# Patient Record
Sex: Female | Born: 1945
Health system: Southern US, Community
[De-identification: ages and names within clinical notes are randomized; demographics above are authoritative.]

## PROBLEM LIST (undated history)

## (undated) DIAGNOSIS — J449 Chronic obstructive pulmonary disease, unspecified: Secondary | ICD-10-CM

## (undated) DIAGNOSIS — E785 Hyperlipidemia, unspecified: Secondary | ICD-10-CM

## (undated) DIAGNOSIS — N189 Chronic kidney disease, unspecified: Secondary | ICD-10-CM

## (undated) DIAGNOSIS — I519 Heart disease, unspecified: Secondary | ICD-10-CM

## (undated) DIAGNOSIS — I10 Essential (primary) hypertension: Secondary | ICD-10-CM

## (undated) HISTORY — PX: KNEE ARTHROSCOPY: SUR90

## (undated) HISTORY — DX: Essential (primary) hypertension: I10

## (undated) HISTORY — DX: Chronic kidney disease, unspecified: N18.9

## (undated) HISTORY — DX: Chronic obstructive pulmonary disease, unspecified: J44.9

## (undated) HISTORY — DX: Heart disease, unspecified: I51.9

## (undated) HISTORY — PX: CARDIAC CATHETERIZATION: SHX172

## (undated) HISTORY — DX: Hyperlipidemia, unspecified: E78.5

## (undated) HISTORY — PX: TUBAL LIGATION: SHX77

## (undated) HISTORY — PX: SHOULDER ARTHROSCOPY: SHX128

---

## 2006-08-09 DIAGNOSIS — I519 Heart disease, unspecified: Secondary | ICD-10-CM

## 2006-08-09 HISTORY — DX: Heart disease, unspecified: I51.9

## 2014-08-16 DIAGNOSIS — M1712 Unilateral primary osteoarthritis, left knee: Secondary | ICD-10-CM | POA: Diagnosis not present

## 2014-10-02 DIAGNOSIS — G5701 Lesion of sciatic nerve, right lower limb: Secondary | ICD-10-CM | POA: Diagnosis not present

## 2014-10-02 DIAGNOSIS — H8110 Benign paroxysmal vertigo, unspecified ear: Secondary | ICD-10-CM | POA: Diagnosis not present

## 2014-10-02 DIAGNOSIS — M19019 Primary osteoarthritis, unspecified shoulder: Secondary | ICD-10-CM | POA: Diagnosis not present

## 2014-10-02 DIAGNOSIS — M179 Osteoarthritis of knee, unspecified: Secondary | ICD-10-CM | POA: Diagnosis not present

## 2014-10-09 DIAGNOSIS — M25551 Pain in right hip: Secondary | ICD-10-CM | POA: Diagnosis not present

## 2014-10-09 DIAGNOSIS — M79651 Pain in right thigh: Secondary | ICD-10-CM | POA: Diagnosis not present

## 2014-10-09 DIAGNOSIS — M6281 Muscle weakness (generalized): Secondary | ICD-10-CM | POA: Diagnosis not present

## 2014-10-09 DIAGNOSIS — R262 Difficulty in walking, not elsewhere classified: Secondary | ICD-10-CM | POA: Diagnosis not present

## 2014-10-11 DIAGNOSIS — M79651 Pain in right thigh: Secondary | ICD-10-CM | POA: Diagnosis not present

## 2014-10-11 DIAGNOSIS — M25551 Pain in right hip: Secondary | ICD-10-CM | POA: Diagnosis not present

## 2014-10-11 DIAGNOSIS — R262 Difficulty in walking, not elsewhere classified: Secondary | ICD-10-CM | POA: Diagnosis not present

## 2014-10-11 DIAGNOSIS — M6281 Muscle weakness (generalized): Secondary | ICD-10-CM | POA: Diagnosis not present

## 2014-10-16 DIAGNOSIS — M25551 Pain in right hip: Secondary | ICD-10-CM | POA: Diagnosis not present

## 2014-10-16 DIAGNOSIS — M6281 Muscle weakness (generalized): Secondary | ICD-10-CM | POA: Diagnosis not present

## 2014-10-16 DIAGNOSIS — R262 Difficulty in walking, not elsewhere classified: Secondary | ICD-10-CM | POA: Diagnosis not present

## 2014-10-16 DIAGNOSIS — M79651 Pain in right thigh: Secondary | ICD-10-CM | POA: Diagnosis not present

## 2014-10-18 DIAGNOSIS — M6281 Muscle weakness (generalized): Secondary | ICD-10-CM | POA: Diagnosis not present

## 2014-10-18 DIAGNOSIS — M25551 Pain in right hip: Secondary | ICD-10-CM | POA: Diagnosis not present

## 2014-10-18 DIAGNOSIS — M79651 Pain in right thigh: Secondary | ICD-10-CM | POA: Diagnosis not present

## 2014-10-18 DIAGNOSIS — R262 Difficulty in walking, not elsewhere classified: Secondary | ICD-10-CM | POA: Diagnosis not present

## 2014-10-23 DIAGNOSIS — R262 Difficulty in walking, not elsewhere classified: Secondary | ICD-10-CM | POA: Diagnosis not present

## 2014-10-23 DIAGNOSIS — M6281 Muscle weakness (generalized): Secondary | ICD-10-CM | POA: Diagnosis not present

## 2014-10-23 DIAGNOSIS — M79651 Pain in right thigh: Secondary | ICD-10-CM | POA: Diagnosis not present

## 2014-10-23 DIAGNOSIS — M25551 Pain in right hip: Secondary | ICD-10-CM | POA: Diagnosis not present

## 2014-10-28 DIAGNOSIS — M6281 Muscle weakness (generalized): Secondary | ICD-10-CM | POA: Diagnosis not present

## 2014-10-28 DIAGNOSIS — M25551 Pain in right hip: Secondary | ICD-10-CM | POA: Diagnosis not present

## 2014-10-28 DIAGNOSIS — M79651 Pain in right thigh: Secondary | ICD-10-CM | POA: Diagnosis not present

## 2014-10-28 DIAGNOSIS — R262 Difficulty in walking, not elsewhere classified: Secondary | ICD-10-CM | POA: Diagnosis not present

## 2014-10-30 DIAGNOSIS — R262 Difficulty in walking, not elsewhere classified: Secondary | ICD-10-CM | POA: Diagnosis not present

## 2014-10-30 DIAGNOSIS — Z6841 Body Mass Index (BMI) 40.0 and over, adult: Secondary | ICD-10-CM | POA: Diagnosis not present

## 2014-10-30 DIAGNOSIS — M25551 Pain in right hip: Secondary | ICD-10-CM | POA: Diagnosis not present

## 2014-10-30 DIAGNOSIS — M79651 Pain in right thigh: Secondary | ICD-10-CM | POA: Diagnosis not present

## 2014-10-30 DIAGNOSIS — M6281 Muscle weakness (generalized): Secondary | ICD-10-CM | POA: Diagnosis not present

## 2014-10-30 DIAGNOSIS — M19019 Primary osteoarthritis, unspecified shoulder: Secondary | ICD-10-CM | POA: Diagnosis not present

## 2014-10-30 DIAGNOSIS — M179 Osteoarthritis of knee, unspecified: Secondary | ICD-10-CM | POA: Diagnosis not present

## 2014-11-04 DIAGNOSIS — R262 Difficulty in walking, not elsewhere classified: Secondary | ICD-10-CM | POA: Diagnosis not present

## 2014-11-04 DIAGNOSIS — M79651 Pain in right thigh: Secondary | ICD-10-CM | POA: Diagnosis not present

## 2014-11-04 DIAGNOSIS — M6281 Muscle weakness (generalized): Secondary | ICD-10-CM | POA: Diagnosis not present

## 2014-11-04 DIAGNOSIS — M25551 Pain in right hip: Secondary | ICD-10-CM | POA: Diagnosis not present

## 2014-11-06 DIAGNOSIS — R262 Difficulty in walking, not elsewhere classified: Secondary | ICD-10-CM | POA: Diagnosis not present

## 2014-11-06 DIAGNOSIS — M25551 Pain in right hip: Secondary | ICD-10-CM | POA: Diagnosis not present

## 2014-11-06 DIAGNOSIS — M79651 Pain in right thigh: Secondary | ICD-10-CM | POA: Diagnosis not present

## 2014-11-06 DIAGNOSIS — M6281 Muscle weakness (generalized): Secondary | ICD-10-CM | POA: Diagnosis not present

## 2014-11-08 DIAGNOSIS — E782 Mixed hyperlipidemia: Secondary | ICD-10-CM | POA: Diagnosis not present

## 2014-11-08 DIAGNOSIS — R7301 Impaired fasting glucose: Secondary | ICD-10-CM | POA: Diagnosis not present

## 2014-11-08 DIAGNOSIS — I129 Hypertensive chronic kidney disease with stage 1 through stage 4 chronic kidney disease, or unspecified chronic kidney disease: Secondary | ICD-10-CM | POA: Diagnosis not present

## 2014-11-11 DIAGNOSIS — M79651 Pain in right thigh: Secondary | ICD-10-CM | POA: Diagnosis not present

## 2014-11-11 DIAGNOSIS — R262 Difficulty in walking, not elsewhere classified: Secondary | ICD-10-CM | POA: Diagnosis not present

## 2014-11-11 DIAGNOSIS — M25551 Pain in right hip: Secondary | ICD-10-CM | POA: Diagnosis not present

## 2014-11-11 DIAGNOSIS — M6281 Muscle weakness (generalized): Secondary | ICD-10-CM | POA: Diagnosis not present

## 2014-11-13 DIAGNOSIS — M6281 Muscle weakness (generalized): Secondary | ICD-10-CM | POA: Diagnosis not present

## 2014-11-13 DIAGNOSIS — R262 Difficulty in walking, not elsewhere classified: Secondary | ICD-10-CM | POA: Diagnosis not present

## 2014-11-13 DIAGNOSIS — M25551 Pain in right hip: Secondary | ICD-10-CM | POA: Diagnosis not present

## 2014-11-13 DIAGNOSIS — M79651 Pain in right thigh: Secondary | ICD-10-CM | POA: Diagnosis not present

## 2014-11-18 DIAGNOSIS — N182 Chronic kidney disease, stage 2 (mild): Secondary | ICD-10-CM | POA: Diagnosis not present

## 2014-11-18 DIAGNOSIS — I129 Hypertensive chronic kidney disease with stage 1 through stage 4 chronic kidney disease, or unspecified chronic kidney disease: Secondary | ICD-10-CM | POA: Diagnosis not present

## 2014-11-18 DIAGNOSIS — E782 Mixed hyperlipidemia: Secondary | ICD-10-CM | POA: Diagnosis not present

## 2014-11-18 DIAGNOSIS — M6281 Muscle weakness (generalized): Secondary | ICD-10-CM | POA: Diagnosis not present

## 2014-11-18 DIAGNOSIS — M25551 Pain in right hip: Secondary | ICD-10-CM | POA: Diagnosis not present

## 2014-11-18 DIAGNOSIS — R7301 Impaired fasting glucose: Secondary | ICD-10-CM | POA: Diagnosis not present

## 2014-11-18 DIAGNOSIS — M79651 Pain in right thigh: Secondary | ICD-10-CM | POA: Diagnosis not present

## 2014-11-18 DIAGNOSIS — R262 Difficulty in walking, not elsewhere classified: Secondary | ICD-10-CM | POA: Diagnosis not present

## 2014-11-20 DIAGNOSIS — M25551 Pain in right hip: Secondary | ICD-10-CM | POA: Diagnosis not present

## 2014-11-20 DIAGNOSIS — Z0000 Encounter for general adult medical examination without abnormal findings: Secondary | ICD-10-CM | POA: Diagnosis not present

## 2014-11-20 DIAGNOSIS — M79651 Pain in right thigh: Secondary | ICD-10-CM | POA: Diagnosis not present

## 2014-11-20 DIAGNOSIS — Z139 Encounter for screening, unspecified: Secondary | ICD-10-CM | POA: Diagnosis not present

## 2014-11-20 DIAGNOSIS — Z1389 Encounter for screening for other disorder: Secondary | ICD-10-CM | POA: Diagnosis not present

## 2014-11-20 DIAGNOSIS — M6281 Muscle weakness (generalized): Secondary | ICD-10-CM | POA: Diagnosis not present

## 2014-11-20 DIAGNOSIS — R262 Difficulty in walking, not elsewhere classified: Secondary | ICD-10-CM | POA: Diagnosis not present

## 2014-11-25 DIAGNOSIS — M25551 Pain in right hip: Secondary | ICD-10-CM | POA: Diagnosis not present

## 2014-11-25 DIAGNOSIS — R262 Difficulty in walking, not elsewhere classified: Secondary | ICD-10-CM | POA: Diagnosis not present

## 2014-11-25 DIAGNOSIS — M6281 Muscle weakness (generalized): Secondary | ICD-10-CM | POA: Diagnosis not present

## 2014-11-25 DIAGNOSIS — M79651 Pain in right thigh: Secondary | ICD-10-CM | POA: Diagnosis not present

## 2014-11-27 DIAGNOSIS — M25551 Pain in right hip: Secondary | ICD-10-CM | POA: Diagnosis not present

## 2014-11-27 DIAGNOSIS — M79651 Pain in right thigh: Secondary | ICD-10-CM | POA: Diagnosis not present

## 2014-11-27 DIAGNOSIS — M6281 Muscle weakness (generalized): Secondary | ICD-10-CM | POA: Diagnosis not present

## 2014-11-27 DIAGNOSIS — R262 Difficulty in walking, not elsewhere classified: Secondary | ICD-10-CM | POA: Diagnosis not present

## 2014-11-29 DIAGNOSIS — M7989 Other specified soft tissue disorders: Secondary | ICD-10-CM | POA: Diagnosis not present

## 2014-11-29 DIAGNOSIS — I8391 Asymptomatic varicose veins of right lower extremity: Secondary | ICD-10-CM | POA: Diagnosis not present

## 2014-11-29 DIAGNOSIS — I83813 Varicose veins of bilateral lower extremities with pain: Secondary | ICD-10-CM | POA: Diagnosis not present

## 2014-12-02 DIAGNOSIS — M6281 Muscle weakness (generalized): Secondary | ICD-10-CM | POA: Diagnosis not present

## 2014-12-02 DIAGNOSIS — M79651 Pain in right thigh: Secondary | ICD-10-CM | POA: Diagnosis not present

## 2014-12-02 DIAGNOSIS — R262 Difficulty in walking, not elsewhere classified: Secondary | ICD-10-CM | POA: Diagnosis not present

## 2014-12-02 DIAGNOSIS — M1712 Unilateral primary osteoarthritis, left knee: Secondary | ICD-10-CM | POA: Diagnosis not present

## 2014-12-02 DIAGNOSIS — M25551 Pain in right hip: Secondary | ICD-10-CM | POA: Diagnosis not present

## 2014-12-02 DIAGNOSIS — M25562 Pain in left knee: Secondary | ICD-10-CM | POA: Diagnosis not present

## 2014-12-04 DIAGNOSIS — M79651 Pain in right thigh: Secondary | ICD-10-CM | POA: Diagnosis not present

## 2014-12-04 DIAGNOSIS — R262 Difficulty in walking, not elsewhere classified: Secondary | ICD-10-CM | POA: Diagnosis not present

## 2014-12-04 DIAGNOSIS — M25551 Pain in right hip: Secondary | ICD-10-CM | POA: Diagnosis not present

## 2014-12-04 DIAGNOSIS — M6281 Muscle weakness (generalized): Secondary | ICD-10-CM | POA: Diagnosis not present

## 2014-12-20 DIAGNOSIS — I83893 Varicose veins of bilateral lower extremities with other complications: Secondary | ICD-10-CM | POA: Diagnosis not present

## 2014-12-20 DIAGNOSIS — I83813 Varicose veins of bilateral lower extremities with pain: Secondary | ICD-10-CM | POA: Diagnosis not present

## 2014-12-27 DIAGNOSIS — I83813 Varicose veins of bilateral lower extremities with pain: Secondary | ICD-10-CM | POA: Diagnosis not present

## 2014-12-27 DIAGNOSIS — I83893 Varicose veins of bilateral lower extremities with other complications: Secondary | ICD-10-CM | POA: Diagnosis not present

## 2015-01-14 DIAGNOSIS — I83813 Varicose veins of bilateral lower extremities with pain: Secondary | ICD-10-CM | POA: Diagnosis not present

## 2015-01-16 DIAGNOSIS — I83893 Varicose veins of bilateral lower extremities with other complications: Secondary | ICD-10-CM | POA: Diagnosis not present

## 2015-01-16 DIAGNOSIS — I8392 Asymptomatic varicose veins of left lower extremity: Secondary | ICD-10-CM | POA: Diagnosis not present

## 2015-01-16 DIAGNOSIS — I82812 Embolism and thrombosis of superficial veins of left lower extremities: Secondary | ICD-10-CM | POA: Diagnosis not present

## 2015-01-20 DIAGNOSIS — I872 Venous insufficiency (chronic) (peripheral): Secondary | ICD-10-CM | POA: Diagnosis not present

## 2015-01-21 DIAGNOSIS — I872 Venous insufficiency (chronic) (peripheral): Secondary | ICD-10-CM | POA: Diagnosis not present

## 2015-02-03 DIAGNOSIS — M25511 Pain in right shoulder: Secondary | ICD-10-CM | POA: Diagnosis not present

## 2015-02-03 DIAGNOSIS — M12811 Other specific arthropathies, not elsewhere classified, right shoulder: Secondary | ICD-10-CM | POA: Diagnosis not present

## 2015-02-06 DIAGNOSIS — I8390 Asymptomatic varicose veins of unspecified lower extremity: Secondary | ICD-10-CM | POA: Diagnosis not present

## 2015-02-06 DIAGNOSIS — I83891 Varicose veins of right lower extremities with other complications: Secondary | ICD-10-CM | POA: Diagnosis not present

## 2015-02-06 DIAGNOSIS — I83813 Varicose veins of bilateral lower extremities with pain: Secondary | ICD-10-CM | POA: Diagnosis not present

## 2015-02-06 DIAGNOSIS — I8391 Asymptomatic varicose veins of right lower extremity: Secondary | ICD-10-CM | POA: Diagnosis not present

## 2015-03-13 DIAGNOSIS — E782 Mixed hyperlipidemia: Secondary | ICD-10-CM | POA: Diagnosis not present

## 2015-03-13 DIAGNOSIS — R7309 Other abnormal glucose: Secondary | ICD-10-CM | POA: Diagnosis not present

## 2015-03-20 DIAGNOSIS — E782 Mixed hyperlipidemia: Secondary | ICD-10-CM | POA: Diagnosis not present

## 2015-03-20 DIAGNOSIS — I129 Hypertensive chronic kidney disease with stage 1 through stage 4 chronic kidney disease, or unspecified chronic kidney disease: Secondary | ICD-10-CM | POA: Diagnosis not present

## 2015-03-20 DIAGNOSIS — R7309 Other abnormal glucose: Secondary | ICD-10-CM | POA: Diagnosis not present

## 2015-03-20 DIAGNOSIS — N182 Chronic kidney disease, stage 2 (mild): Secondary | ICD-10-CM | POA: Diagnosis not present

## 2015-03-31 DIAGNOSIS — Z1231 Encounter for screening mammogram for malignant neoplasm of breast: Secondary | ICD-10-CM | POA: Diagnosis not present

## 2015-04-24 DIAGNOSIS — G8929 Other chronic pain: Secondary | ICD-10-CM | POA: Diagnosis not present

## 2015-04-24 DIAGNOSIS — Z6841 Body Mass Index (BMI) 40.0 and over, adult: Secondary | ICD-10-CM | POA: Diagnosis not present

## 2015-04-24 DIAGNOSIS — M2550 Pain in unspecified joint: Secondary | ICD-10-CM | POA: Diagnosis not present

## 2015-05-10 DIAGNOSIS — M25511 Pain in right shoulder: Secondary | ICD-10-CM | POA: Diagnosis not present

## 2015-05-12 DIAGNOSIS — M009 Pyogenic arthritis, unspecified: Secondary | ICD-10-CM | POA: Diagnosis not present

## 2015-05-12 DIAGNOSIS — Z79899 Other long term (current) drug therapy: Secondary | ICD-10-CM | POA: Diagnosis not present

## 2015-05-12 DIAGNOSIS — E7800 Pure hypercholesterolemia, unspecified: Secondary | ICD-10-CM | POA: Diagnosis not present

## 2015-05-12 DIAGNOSIS — R0602 Shortness of breath: Secondary | ICD-10-CM | POA: Diagnosis not present

## 2015-05-12 DIAGNOSIS — K219 Gastro-esophageal reflux disease without esophagitis: Secondary | ICD-10-CM | POA: Diagnosis not present

## 2015-05-12 DIAGNOSIS — E876 Hypokalemia: Secondary | ICD-10-CM | POA: Diagnosis not present

## 2015-05-12 DIAGNOSIS — R0902 Hypoxemia: Secondary | ICD-10-CM | POA: Diagnosis not present

## 2015-05-12 DIAGNOSIS — Z7952 Long term (current) use of systemic steroids: Secondary | ICD-10-CM | POA: Diagnosis not present

## 2015-05-12 DIAGNOSIS — J309 Allergic rhinitis, unspecified: Secondary | ICD-10-CM | POA: Diagnosis not present

## 2015-05-12 DIAGNOSIS — B957 Other staphylococcus as the cause of diseases classified elsewhere: Secondary | ICD-10-CM | POA: Diagnosis not present

## 2015-05-12 DIAGNOSIS — M25511 Pain in right shoulder: Secondary | ICD-10-CM | POA: Diagnosis not present

## 2015-05-12 DIAGNOSIS — M00011 Staphylococcal arthritis, right shoulder: Secondary | ICD-10-CM | POA: Diagnosis not present

## 2015-05-12 DIAGNOSIS — I509 Heart failure, unspecified: Secondary | ICD-10-CM | POA: Diagnosis not present

## 2015-05-12 DIAGNOSIS — A4901 Methicillin susceptible Staphylococcus aureus infection, unspecified site: Secondary | ICD-10-CM | POA: Diagnosis not present

## 2015-05-12 DIAGNOSIS — D649 Anemia, unspecified: Secondary | ICD-10-CM | POA: Diagnosis not present

## 2015-05-12 DIAGNOSIS — Z01810 Encounter for preprocedural cardiovascular examination: Secondary | ICD-10-CM | POA: Diagnosis not present

## 2015-05-12 DIAGNOSIS — M25411 Effusion, right shoulder: Secondary | ICD-10-CM | POA: Diagnosis not present

## 2015-05-12 DIAGNOSIS — I517 Cardiomegaly: Secondary | ICD-10-CM | POA: Diagnosis not present

## 2015-05-12 DIAGNOSIS — M00811 Arthritis due to other bacteria, right shoulder: Secondary | ICD-10-CM | POA: Diagnosis not present

## 2015-05-12 DIAGNOSIS — A419 Sepsis, unspecified organism: Secondary | ICD-10-CM | POA: Diagnosis not present

## 2015-05-12 DIAGNOSIS — Z7982 Long term (current) use of aspirin: Secondary | ICD-10-CM | POA: Diagnosis not present

## 2015-05-12 DIAGNOSIS — Z23 Encounter for immunization: Secondary | ICD-10-CM | POA: Diagnosis not present

## 2015-05-12 DIAGNOSIS — I10 Essential (primary) hypertension: Secondary | ICD-10-CM | POA: Diagnosis not present

## 2015-05-12 DIAGNOSIS — Z452 Encounter for adjustment and management of vascular access device: Secondary | ICD-10-CM | POA: Diagnosis not present

## 2015-05-12 DIAGNOSIS — G8918 Other acute postprocedural pain: Secondary | ICD-10-CM | POA: Diagnosis not present

## 2015-05-12 DIAGNOSIS — Z87891 Personal history of nicotine dependence: Secondary | ICD-10-CM | POA: Diagnosis not present

## 2015-05-12 DIAGNOSIS — Z6841 Body Mass Index (BMI) 40.0 and over, adult: Secondary | ICD-10-CM | POA: Diagnosis not present

## 2015-05-12 DIAGNOSIS — J811 Chronic pulmonary edema: Secondary | ICD-10-CM | POA: Diagnosis not present

## 2015-05-22 DIAGNOSIS — Z23 Encounter for immunization: Secondary | ICD-10-CM | POA: Diagnosis not present

## 2015-05-22 DIAGNOSIS — G8918 Other acute postprocedural pain: Secondary | ICD-10-CM | POA: Diagnosis not present

## 2015-05-22 DIAGNOSIS — Z87891 Personal history of nicotine dependence: Secondary | ICD-10-CM | POA: Diagnosis not present

## 2015-05-22 DIAGNOSIS — D649 Anemia, unspecified: Secondary | ICD-10-CM | POA: Diagnosis not present

## 2015-05-22 DIAGNOSIS — I517 Cardiomegaly: Secondary | ICD-10-CM | POA: Diagnosis not present

## 2015-05-22 DIAGNOSIS — Z7982 Long term (current) use of aspirin: Secondary | ICD-10-CM | POA: Diagnosis not present

## 2015-05-22 DIAGNOSIS — E876 Hypokalemia: Secondary | ICD-10-CM | POA: Diagnosis not present

## 2015-05-22 DIAGNOSIS — Z6841 Body Mass Index (BMI) 40.0 and over, adult: Secondary | ICD-10-CM | POA: Diagnosis not present

## 2015-05-22 DIAGNOSIS — Z7952 Long term (current) use of systemic steroids: Secondary | ICD-10-CM | POA: Diagnosis not present

## 2015-05-22 DIAGNOSIS — E7800 Pure hypercholesterolemia, unspecified: Secondary | ICD-10-CM | POA: Diagnosis not present

## 2015-05-22 DIAGNOSIS — J309 Allergic rhinitis, unspecified: Secondary | ICD-10-CM | POA: Diagnosis not present

## 2015-05-22 DIAGNOSIS — R0902 Hypoxemia: Secondary | ICD-10-CM | POA: Diagnosis not present

## 2015-05-22 DIAGNOSIS — A419 Sepsis, unspecified organism: Secondary | ICD-10-CM | POA: Diagnosis not present

## 2015-05-22 DIAGNOSIS — Z79899 Other long term (current) drug therapy: Secondary | ICD-10-CM | POA: Diagnosis not present

## 2015-05-22 DIAGNOSIS — M00011 Staphylococcal arthritis, right shoulder: Secondary | ICD-10-CM | POA: Diagnosis not present

## 2015-05-22 DIAGNOSIS — K219 Gastro-esophageal reflux disease without esophagitis: Secondary | ICD-10-CM | POA: Diagnosis not present

## 2015-05-22 DIAGNOSIS — I10 Essential (primary) hypertension: Secondary | ICD-10-CM | POA: Diagnosis not present

## 2015-05-23 DIAGNOSIS — I10 Essential (primary) hypertension: Secondary | ICD-10-CM | POA: Diagnosis not present

## 2015-05-23 DIAGNOSIS — Z742 Need for assistance at home and no other household member able to render care: Secondary | ICD-10-CM | POA: Diagnosis not present

## 2015-05-23 DIAGNOSIS — Z792 Long term (current) use of antibiotics: Secondary | ICD-10-CM | POA: Diagnosis not present

## 2015-05-23 DIAGNOSIS — M25411 Effusion, right shoulder: Secondary | ICD-10-CM | POA: Diagnosis not present

## 2015-05-23 DIAGNOSIS — Z7982 Long term (current) use of aspirin: Secondary | ICD-10-CM | POA: Diagnosis not present

## 2015-05-23 DIAGNOSIS — M009 Pyogenic arthritis, unspecified: Secondary | ICD-10-CM | POA: Diagnosis not present

## 2015-05-23 DIAGNOSIS — A419 Sepsis, unspecified organism: Secondary | ICD-10-CM | POA: Diagnosis not present

## 2015-05-23 DIAGNOSIS — Z452 Encounter for adjustment and management of vascular access device: Secondary | ICD-10-CM | POA: Diagnosis not present

## 2015-05-23 DIAGNOSIS — M00011 Staphylococcal arthritis, right shoulder: Secondary | ICD-10-CM | POA: Diagnosis not present

## 2015-05-24 DIAGNOSIS — Z792 Long term (current) use of antibiotics: Secondary | ICD-10-CM | POA: Diagnosis not present

## 2015-05-24 DIAGNOSIS — I10 Essential (primary) hypertension: Secondary | ICD-10-CM | POA: Diagnosis not present

## 2015-05-24 DIAGNOSIS — Z452 Encounter for adjustment and management of vascular access device: Secondary | ICD-10-CM | POA: Diagnosis not present

## 2015-05-24 DIAGNOSIS — M00011 Staphylococcal arthritis, right shoulder: Secondary | ICD-10-CM | POA: Diagnosis not present

## 2015-05-24 DIAGNOSIS — Z7982 Long term (current) use of aspirin: Secondary | ICD-10-CM | POA: Diagnosis not present

## 2015-05-24 DIAGNOSIS — Z742 Need for assistance at home and no other household member able to render care: Secondary | ICD-10-CM | POA: Diagnosis not present

## 2015-05-25 DIAGNOSIS — Z792 Long term (current) use of antibiotics: Secondary | ICD-10-CM | POA: Diagnosis not present

## 2015-05-25 DIAGNOSIS — I10 Essential (primary) hypertension: Secondary | ICD-10-CM | POA: Diagnosis not present

## 2015-05-25 DIAGNOSIS — Z7982 Long term (current) use of aspirin: Secondary | ICD-10-CM | POA: Diagnosis not present

## 2015-05-25 DIAGNOSIS — Z452 Encounter for adjustment and management of vascular access device: Secondary | ICD-10-CM | POA: Diagnosis not present

## 2015-05-25 DIAGNOSIS — Z742 Need for assistance at home and no other household member able to render care: Secondary | ICD-10-CM | POA: Diagnosis not present

## 2015-05-25 DIAGNOSIS — M00011 Staphylococcal arthritis, right shoulder: Secondary | ICD-10-CM | POA: Diagnosis not present

## 2015-05-26 DIAGNOSIS — Z7982 Long term (current) use of aspirin: Secondary | ICD-10-CM | POA: Diagnosis not present

## 2015-05-26 DIAGNOSIS — M00011 Staphylococcal arthritis, right shoulder: Secondary | ICD-10-CM | POA: Diagnosis not present

## 2015-05-26 DIAGNOSIS — I10 Essential (primary) hypertension: Secondary | ICD-10-CM | POA: Diagnosis not present

## 2015-05-26 DIAGNOSIS — M009 Pyogenic arthritis, unspecified: Secondary | ICD-10-CM | POA: Diagnosis not present

## 2015-05-26 DIAGNOSIS — Z792 Long term (current) use of antibiotics: Secondary | ICD-10-CM | POA: Diagnosis not present

## 2015-05-26 DIAGNOSIS — Z742 Need for assistance at home and no other household member able to render care: Secondary | ICD-10-CM | POA: Diagnosis not present

## 2015-05-26 DIAGNOSIS — M25411 Effusion, right shoulder: Secondary | ICD-10-CM | POA: Diagnosis not present

## 2015-05-26 DIAGNOSIS — Z452 Encounter for adjustment and management of vascular access device: Secondary | ICD-10-CM | POA: Diagnosis not present

## 2015-05-26 DIAGNOSIS — A419 Sepsis, unspecified organism: Secondary | ICD-10-CM | POA: Diagnosis not present

## 2015-05-30 DIAGNOSIS — Z742 Need for assistance at home and no other household member able to render care: Secondary | ICD-10-CM | POA: Diagnosis not present

## 2015-05-30 DIAGNOSIS — Z452 Encounter for adjustment and management of vascular access device: Secondary | ICD-10-CM | POA: Diagnosis not present

## 2015-05-30 DIAGNOSIS — Z7982 Long term (current) use of aspirin: Secondary | ICD-10-CM | POA: Diagnosis not present

## 2015-05-30 DIAGNOSIS — I10 Essential (primary) hypertension: Secondary | ICD-10-CM | POA: Diagnosis not present

## 2015-05-30 DIAGNOSIS — M00011 Staphylococcal arthritis, right shoulder: Secondary | ICD-10-CM | POA: Diagnosis not present

## 2015-05-30 DIAGNOSIS — Z792 Long term (current) use of antibiotics: Secondary | ICD-10-CM | POA: Diagnosis not present

## 2015-06-02 DIAGNOSIS — M00011 Staphylococcal arthritis, right shoulder: Secondary | ICD-10-CM | POA: Diagnosis not present

## 2015-06-02 DIAGNOSIS — I10 Essential (primary) hypertension: Secondary | ICD-10-CM | POA: Diagnosis not present

## 2015-06-02 DIAGNOSIS — Z7982 Long term (current) use of aspirin: Secondary | ICD-10-CM | POA: Diagnosis not present

## 2015-06-02 DIAGNOSIS — Z5181 Encounter for therapeutic drug level monitoring: Secondary | ICD-10-CM | POA: Diagnosis not present

## 2015-06-02 DIAGNOSIS — Z742 Need for assistance at home and no other household member able to render care: Secondary | ICD-10-CM | POA: Diagnosis not present

## 2015-06-02 DIAGNOSIS — Z792 Long term (current) use of antibiotics: Secondary | ICD-10-CM | POA: Diagnosis not present

## 2015-06-02 DIAGNOSIS — M009 Pyogenic arthritis, unspecified: Secondary | ICD-10-CM | POA: Diagnosis not present

## 2015-06-02 DIAGNOSIS — A419 Sepsis, unspecified organism: Secondary | ICD-10-CM | POA: Diagnosis not present

## 2015-06-02 DIAGNOSIS — Z452 Encounter for adjustment and management of vascular access device: Secondary | ICD-10-CM | POA: Diagnosis not present

## 2015-06-04 DIAGNOSIS — Z452 Encounter for adjustment and management of vascular access device: Secondary | ICD-10-CM | POA: Diagnosis not present

## 2015-06-04 DIAGNOSIS — I10 Essential (primary) hypertension: Secondary | ICD-10-CM | POA: Diagnosis not present

## 2015-06-04 DIAGNOSIS — M00011 Staphylococcal arthritis, right shoulder: Secondary | ICD-10-CM | POA: Diagnosis not present

## 2015-06-04 DIAGNOSIS — Z7982 Long term (current) use of aspirin: Secondary | ICD-10-CM | POA: Diagnosis not present

## 2015-06-04 DIAGNOSIS — Z742 Need for assistance at home and no other household member able to render care: Secondary | ICD-10-CM | POA: Diagnosis not present

## 2015-06-04 DIAGNOSIS — Z792 Long term (current) use of antibiotics: Secondary | ICD-10-CM | POA: Diagnosis not present

## 2015-06-07 DIAGNOSIS — M009 Pyogenic arthritis, unspecified: Secondary | ICD-10-CM | POA: Diagnosis not present

## 2015-06-07 DIAGNOSIS — A419 Sepsis, unspecified organism: Secondary | ICD-10-CM | POA: Diagnosis not present

## 2015-06-07 DIAGNOSIS — M25411 Effusion, right shoulder: Secondary | ICD-10-CM | POA: Diagnosis not present

## 2015-06-09 DIAGNOSIS — Z452 Encounter for adjustment and management of vascular access device: Secondary | ICD-10-CM | POA: Diagnosis not present

## 2015-06-09 DIAGNOSIS — Z7982 Long term (current) use of aspirin: Secondary | ICD-10-CM | POA: Diagnosis not present

## 2015-06-09 DIAGNOSIS — I10 Essential (primary) hypertension: Secondary | ICD-10-CM | POA: Diagnosis not present

## 2015-06-09 DIAGNOSIS — M00011 Staphylococcal arthritis, right shoulder: Secondary | ICD-10-CM | POA: Diagnosis not present

## 2015-06-09 DIAGNOSIS — Z792 Long term (current) use of antibiotics: Secondary | ICD-10-CM | POA: Diagnosis not present

## 2015-06-09 DIAGNOSIS — Z742 Need for assistance at home and no other household member able to render care: Secondary | ICD-10-CM | POA: Diagnosis not present

## 2015-06-10 DIAGNOSIS — Z742 Need for assistance at home and no other household member able to render care: Secondary | ICD-10-CM | POA: Diagnosis not present

## 2015-06-10 DIAGNOSIS — Z7982 Long term (current) use of aspirin: Secondary | ICD-10-CM | POA: Diagnosis not present

## 2015-06-10 DIAGNOSIS — Z792 Long term (current) use of antibiotics: Secondary | ICD-10-CM | POA: Diagnosis not present

## 2015-06-10 DIAGNOSIS — Z452 Encounter for adjustment and management of vascular access device: Secondary | ICD-10-CM | POA: Diagnosis not present

## 2015-06-10 DIAGNOSIS — I10 Essential (primary) hypertension: Secondary | ICD-10-CM | POA: Diagnosis not present

## 2015-06-10 DIAGNOSIS — M00011 Staphylococcal arthritis, right shoulder: Secondary | ICD-10-CM | POA: Diagnosis not present

## 2015-06-13 DIAGNOSIS — M1712 Unilateral primary osteoarthritis, left knee: Secondary | ICD-10-CM | POA: Diagnosis not present

## 2015-06-16 DIAGNOSIS — M00011 Staphylococcal arthritis, right shoulder: Secondary | ICD-10-CM | POA: Diagnosis not present

## 2015-06-16 DIAGNOSIS — I10 Essential (primary) hypertension: Secondary | ICD-10-CM | POA: Diagnosis not present

## 2015-06-16 DIAGNOSIS — Z792 Long term (current) use of antibiotics: Secondary | ICD-10-CM | POA: Diagnosis not present

## 2015-06-16 DIAGNOSIS — Z452 Encounter for adjustment and management of vascular access device: Secondary | ICD-10-CM | POA: Diagnosis not present

## 2015-06-16 DIAGNOSIS — Z7982 Long term (current) use of aspirin: Secondary | ICD-10-CM | POA: Diagnosis not present

## 2015-06-16 DIAGNOSIS — Z742 Need for assistance at home and no other household member able to render care: Secondary | ICD-10-CM | POA: Diagnosis not present

## 2015-06-19 DIAGNOSIS — R7309 Other abnormal glucose: Secondary | ICD-10-CM | POA: Diagnosis not present

## 2015-06-19 DIAGNOSIS — E782 Mixed hyperlipidemia: Secondary | ICD-10-CM | POA: Diagnosis not present

## 2015-06-19 DIAGNOSIS — I129 Hypertensive chronic kidney disease with stage 1 through stage 4 chronic kidney disease, or unspecified chronic kidney disease: Secondary | ICD-10-CM | POA: Diagnosis not present

## 2015-06-22 DIAGNOSIS — M009 Pyogenic arthritis, unspecified: Secondary | ICD-10-CM | POA: Diagnosis not present

## 2015-06-22 DIAGNOSIS — M25411 Effusion, right shoulder: Secondary | ICD-10-CM | POA: Diagnosis not present

## 2015-06-22 DIAGNOSIS — A419 Sepsis, unspecified organism: Secondary | ICD-10-CM | POA: Diagnosis not present

## 2015-06-23 DIAGNOSIS — M00011 Staphylococcal arthritis, right shoulder: Secondary | ICD-10-CM | POA: Diagnosis not present

## 2015-06-23 DIAGNOSIS — Z792 Long term (current) use of antibiotics: Secondary | ICD-10-CM | POA: Diagnosis not present

## 2015-06-23 DIAGNOSIS — I10 Essential (primary) hypertension: Secondary | ICD-10-CM | POA: Diagnosis not present

## 2015-06-23 DIAGNOSIS — Z7982 Long term (current) use of aspirin: Secondary | ICD-10-CM | POA: Diagnosis not present

## 2015-06-23 DIAGNOSIS — Z452 Encounter for adjustment and management of vascular access device: Secondary | ICD-10-CM | POA: Diagnosis not present

## 2015-06-23 DIAGNOSIS — Z742 Need for assistance at home and no other household member able to render care: Secondary | ICD-10-CM | POA: Diagnosis not present

## 2015-06-26 DIAGNOSIS — R7303 Prediabetes: Secondary | ICD-10-CM | POA: Diagnosis not present

## 2015-06-26 DIAGNOSIS — E782 Mixed hyperlipidemia: Secondary | ICD-10-CM | POA: Diagnosis not present

## 2015-06-26 DIAGNOSIS — I129 Hypertensive chronic kidney disease with stage 1 through stage 4 chronic kidney disease, or unspecified chronic kidney disease: Secondary | ICD-10-CM | POA: Diagnosis not present

## 2015-06-26 DIAGNOSIS — N182 Chronic kidney disease, stage 2 (mild): Secondary | ICD-10-CM | POA: Diagnosis not present

## 2015-06-27 DIAGNOSIS — M00011 Staphylococcal arthritis, right shoulder: Secondary | ICD-10-CM | POA: Diagnosis not present

## 2015-06-27 DIAGNOSIS — M009 Pyogenic arthritis, unspecified: Secondary | ICD-10-CM | POA: Diagnosis not present

## 2015-06-27 DIAGNOSIS — T887XXS Unspecified adverse effect of drug or medicament, sequela: Secondary | ICD-10-CM | POA: Diagnosis not present

## 2015-07-01 DIAGNOSIS — Z742 Need for assistance at home and no other household member able to render care: Secondary | ICD-10-CM | POA: Diagnosis not present

## 2015-07-01 DIAGNOSIS — M00011 Staphylococcal arthritis, right shoulder: Secondary | ICD-10-CM | POA: Diagnosis not present

## 2015-07-01 DIAGNOSIS — Z452 Encounter for adjustment and management of vascular access device: Secondary | ICD-10-CM | POA: Diagnosis not present

## 2015-07-01 DIAGNOSIS — I10 Essential (primary) hypertension: Secondary | ICD-10-CM | POA: Diagnosis not present

## 2015-07-01 DIAGNOSIS — Z792 Long term (current) use of antibiotics: Secondary | ICD-10-CM | POA: Diagnosis not present

## 2015-07-01 DIAGNOSIS — Z7982 Long term (current) use of aspirin: Secondary | ICD-10-CM | POA: Diagnosis not present

## 2015-07-16 DIAGNOSIS — M1712 Unilateral primary osteoarthritis, left knee: Secondary | ICD-10-CM | POA: Diagnosis not present

## 2015-07-25 DIAGNOSIS — M1712 Unilateral primary osteoarthritis, left knee: Secondary | ICD-10-CM | POA: Diagnosis not present

## 2015-08-01 DIAGNOSIS — M1712 Unilateral primary osteoarthritis, left knee: Secondary | ICD-10-CM | POA: Diagnosis not present

## 2015-08-26 DIAGNOSIS — I83813 Varicose veins of bilateral lower extremities with pain: Secondary | ICD-10-CM | POA: Diagnosis not present

## 2015-08-26 DIAGNOSIS — I8393 Asymptomatic varicose veins of bilateral lower extremities: Secondary | ICD-10-CM | POA: Diagnosis not present

## 2015-09-10 DIAGNOSIS — M25562 Pain in left knee: Secondary | ICD-10-CM | POA: Diagnosis not present

## 2015-09-10 DIAGNOSIS — M1712 Unilateral primary osteoarthritis, left knee: Secondary | ICD-10-CM | POA: Diagnosis not present

## 2015-09-18 DIAGNOSIS — N182 Chronic kidney disease, stage 2 (mild): Secondary | ICD-10-CM | POA: Diagnosis not present

## 2015-09-18 DIAGNOSIS — M2342 Loose body in knee, left knee: Secondary | ICD-10-CM | POA: Diagnosis not present

## 2015-09-18 DIAGNOSIS — I129 Hypertensive chronic kidney disease with stage 1 through stage 4 chronic kidney disease, or unspecified chronic kidney disease: Secondary | ICD-10-CM | POA: Diagnosis not present

## 2015-09-18 DIAGNOSIS — E782 Mixed hyperlipidemia: Secondary | ICD-10-CM | POA: Diagnosis not present

## 2015-09-18 DIAGNOSIS — M23222 Derangement of posterior horn of medial meniscus due to old tear or injury, left knee: Secondary | ICD-10-CM | POA: Diagnosis not present

## 2015-09-18 DIAGNOSIS — M25562 Pain in left knee: Secondary | ICD-10-CM | POA: Diagnosis not present

## 2015-09-19 DIAGNOSIS — M2340 Loose body in knee, unspecified knee: Secondary | ICD-10-CM | POA: Diagnosis not present

## 2015-09-19 DIAGNOSIS — M25562 Pain in left knee: Secondary | ICD-10-CM | POA: Diagnosis not present

## 2015-09-19 DIAGNOSIS — S83232A Complex tear of medial meniscus, current injury, left knee, initial encounter: Secondary | ICD-10-CM | POA: Diagnosis not present

## 2015-09-19 DIAGNOSIS — M1712 Unilateral primary osteoarthritis, left knee: Secondary | ICD-10-CM | POA: Diagnosis not present

## 2015-09-26 DIAGNOSIS — M25562 Pain in left knee: Secondary | ICD-10-CM | POA: Diagnosis not present

## 2015-09-26 DIAGNOSIS — I129 Hypertensive chronic kidney disease with stage 1 through stage 4 chronic kidney disease, or unspecified chronic kidney disease: Secondary | ICD-10-CM | POA: Diagnosis not present

## 2015-09-26 DIAGNOSIS — N182 Chronic kidney disease, stage 2 (mild): Secondary | ICD-10-CM | POA: Diagnosis not present

## 2015-09-26 DIAGNOSIS — E782 Mixed hyperlipidemia: Secondary | ICD-10-CM | POA: Diagnosis not present

## 2015-09-26 DIAGNOSIS — Z6839 Body mass index (BMI) 39.0-39.9, adult: Secondary | ICD-10-CM | POA: Diagnosis not present

## 2015-10-02 DIAGNOSIS — F419 Anxiety disorder, unspecified: Secondary | ICD-10-CM | POA: Diagnosis not present

## 2015-10-02 DIAGNOSIS — F329 Major depressive disorder, single episode, unspecified: Secondary | ICD-10-CM | POA: Diagnosis not present

## 2015-10-02 DIAGNOSIS — K219 Gastro-esophageal reflux disease without esophagitis: Secondary | ICD-10-CM | POA: Diagnosis not present

## 2015-10-02 DIAGNOSIS — Z87891 Personal history of nicotine dependence: Secondary | ICD-10-CM | POA: Diagnosis not present

## 2015-10-02 DIAGNOSIS — M1712 Unilateral primary osteoarthritis, left knee: Secondary | ICD-10-CM | POA: Diagnosis not present

## 2015-10-02 DIAGNOSIS — M2340 Loose body in knee, unspecified knee: Secondary | ICD-10-CM | POA: Diagnosis not present

## 2015-10-02 DIAGNOSIS — Z79899 Other long term (current) drug therapy: Secondary | ICD-10-CM | POA: Diagnosis not present

## 2015-10-02 DIAGNOSIS — I10 Essential (primary) hypertension: Secondary | ICD-10-CM | POA: Diagnosis not present

## 2015-10-02 DIAGNOSIS — S83282A Other tear of lateral meniscus, current injury, left knee, initial encounter: Secondary | ICD-10-CM | POA: Diagnosis not present

## 2015-10-02 DIAGNOSIS — M65862 Other synovitis and tenosynovitis, left lower leg: Secondary | ICD-10-CM | POA: Diagnosis not present

## 2015-10-02 DIAGNOSIS — S83272A Complex tear of lateral meniscus, current injury, left knee, initial encounter: Secondary | ICD-10-CM | POA: Diagnosis not present

## 2015-10-02 DIAGNOSIS — S83232A Complex tear of medial meniscus, current injury, left knee, initial encounter: Secondary | ICD-10-CM | POA: Diagnosis not present

## 2015-10-02 DIAGNOSIS — E669 Obesity, unspecified: Secondary | ICD-10-CM | POA: Diagnosis not present

## 2015-10-02 DIAGNOSIS — M2242 Chondromalacia patellae, left knee: Secondary | ICD-10-CM | POA: Diagnosis not present

## 2015-10-02 DIAGNOSIS — Z6838 Body mass index (BMI) 38.0-38.9, adult: Secondary | ICD-10-CM | POA: Diagnosis not present

## 2015-10-06 DIAGNOSIS — R2689 Other abnormalities of gait and mobility: Secondary | ICD-10-CM | POA: Diagnosis not present

## 2015-10-06 DIAGNOSIS — M25562 Pain in left knee: Secondary | ICD-10-CM | POA: Diagnosis not present

## 2015-10-06 DIAGNOSIS — S83232D Complex tear of medial meniscus, current injury, left knee, subsequent encounter: Secondary | ICD-10-CM | POA: Diagnosis not present

## 2015-10-09 DIAGNOSIS — M25562 Pain in left knee: Secondary | ICD-10-CM | POA: Diagnosis not present

## 2015-10-09 DIAGNOSIS — R2689 Other abnormalities of gait and mobility: Secondary | ICD-10-CM | POA: Diagnosis not present

## 2015-10-09 DIAGNOSIS — S83232D Complex tear of medial meniscus, current injury, left knee, subsequent encounter: Secondary | ICD-10-CM | POA: Diagnosis not present

## 2015-10-13 DIAGNOSIS — R2689 Other abnormalities of gait and mobility: Secondary | ICD-10-CM | POA: Diagnosis not present

## 2015-10-13 DIAGNOSIS — M25562 Pain in left knee: Secondary | ICD-10-CM | POA: Diagnosis not present

## 2015-10-13 DIAGNOSIS — S83232D Complex tear of medial meniscus, current injury, left knee, subsequent encounter: Secondary | ICD-10-CM | POA: Diagnosis not present

## 2015-10-15 DIAGNOSIS — M25562 Pain in left knee: Secondary | ICD-10-CM | POA: Diagnosis not present

## 2015-10-15 DIAGNOSIS — S83232D Complex tear of medial meniscus, current injury, left knee, subsequent encounter: Secondary | ICD-10-CM | POA: Diagnosis not present

## 2015-10-15 DIAGNOSIS — R2689 Other abnormalities of gait and mobility: Secondary | ICD-10-CM | POA: Diagnosis not present

## 2015-10-20 DIAGNOSIS — M25562 Pain in left knee: Secondary | ICD-10-CM | POA: Diagnosis not present

## 2015-10-20 DIAGNOSIS — S83232D Complex tear of medial meniscus, current injury, left knee, subsequent encounter: Secondary | ICD-10-CM | POA: Diagnosis not present

## 2015-10-20 DIAGNOSIS — R2689 Other abnormalities of gait and mobility: Secondary | ICD-10-CM | POA: Diagnosis not present

## 2015-10-23 DIAGNOSIS — M25562 Pain in left knee: Secondary | ICD-10-CM | POA: Diagnosis not present

## 2015-10-23 DIAGNOSIS — R2689 Other abnormalities of gait and mobility: Secondary | ICD-10-CM | POA: Diagnosis not present

## 2015-10-23 DIAGNOSIS — S83232D Complex tear of medial meniscus, current injury, left knee, subsequent encounter: Secondary | ICD-10-CM | POA: Diagnosis not present

## 2015-10-27 DIAGNOSIS — R2689 Other abnormalities of gait and mobility: Secondary | ICD-10-CM | POA: Diagnosis not present

## 2015-10-27 DIAGNOSIS — S83232D Complex tear of medial meniscus, current injury, left knee, subsequent encounter: Secondary | ICD-10-CM | POA: Diagnosis not present

## 2015-10-27 DIAGNOSIS — M25562 Pain in left knee: Secondary | ICD-10-CM | POA: Diagnosis not present

## 2015-10-29 DIAGNOSIS — S83232D Complex tear of medial meniscus, current injury, left knee, subsequent encounter: Secondary | ICD-10-CM | POA: Diagnosis not present

## 2015-10-29 DIAGNOSIS — M25562 Pain in left knee: Secondary | ICD-10-CM | POA: Diagnosis not present

## 2015-10-29 DIAGNOSIS — R2689 Other abnormalities of gait and mobility: Secondary | ICD-10-CM | POA: Diagnosis not present

## 2015-11-03 DIAGNOSIS — S83232D Complex tear of medial meniscus, current injury, left knee, subsequent encounter: Secondary | ICD-10-CM | POA: Diagnosis not present

## 2015-11-03 DIAGNOSIS — M25562 Pain in left knee: Secondary | ICD-10-CM | POA: Diagnosis not present

## 2015-11-03 DIAGNOSIS — R2689 Other abnormalities of gait and mobility: Secondary | ICD-10-CM | POA: Diagnosis not present

## 2015-11-07 DIAGNOSIS — M1712 Unilateral primary osteoarthritis, left knee: Secondary | ICD-10-CM | POA: Diagnosis not present

## 2015-11-13 DIAGNOSIS — M25562 Pain in left knee: Secondary | ICD-10-CM | POA: Diagnosis not present

## 2015-11-13 DIAGNOSIS — R2689 Other abnormalities of gait and mobility: Secondary | ICD-10-CM | POA: Diagnosis not present

## 2015-11-13 DIAGNOSIS — S83232D Complex tear of medial meniscus, current injury, left knee, subsequent encounter: Secondary | ICD-10-CM | POA: Diagnosis not present

## 2015-11-17 DIAGNOSIS — M25562 Pain in left knee: Secondary | ICD-10-CM | POA: Diagnosis not present

## 2015-11-17 DIAGNOSIS — R2689 Other abnormalities of gait and mobility: Secondary | ICD-10-CM | POA: Diagnosis not present

## 2015-11-17 DIAGNOSIS — S83232D Complex tear of medial meniscus, current injury, left knee, subsequent encounter: Secondary | ICD-10-CM | POA: Diagnosis not present

## 2015-11-27 DIAGNOSIS — Z0000 Encounter for general adult medical examination without abnormal findings: Secondary | ICD-10-CM | POA: Diagnosis not present

## 2015-11-27 DIAGNOSIS — Z139 Encounter for screening, unspecified: Secondary | ICD-10-CM | POA: Diagnosis not present

## 2015-11-27 DIAGNOSIS — Z1389 Encounter for screening for other disorder: Secondary | ICD-10-CM | POA: Diagnosis not present

## 2015-11-27 DIAGNOSIS — Z7189 Other specified counseling: Secondary | ICD-10-CM | POA: Diagnosis not present

## 2015-12-12 DIAGNOSIS — Z9889 Other specified postprocedural states: Secondary | ICD-10-CM | POA: Diagnosis not present

## 2015-12-12 DIAGNOSIS — M75120 Complete rotator cuff tear or rupture of unspecified shoulder, not specified as traumatic: Secondary | ICD-10-CM | POA: Diagnosis not present

## 2016-01-20 DIAGNOSIS — E782 Mixed hyperlipidemia: Secondary | ICD-10-CM | POA: Diagnosis not present

## 2016-01-20 DIAGNOSIS — N182 Chronic kidney disease, stage 2 (mild): Secondary | ICD-10-CM | POA: Diagnosis not present

## 2016-01-20 DIAGNOSIS — I129 Hypertensive chronic kidney disease with stage 1 through stage 4 chronic kidney disease, or unspecified chronic kidney disease: Secondary | ICD-10-CM | POA: Diagnosis not present

## 2016-01-23 DIAGNOSIS — N182 Chronic kidney disease, stage 2 (mild): Secondary | ICD-10-CM | POA: Diagnosis not present

## 2016-01-23 DIAGNOSIS — Z6839 Body mass index (BMI) 39.0-39.9, adult: Secondary | ICD-10-CM | POA: Diagnosis not present

## 2016-01-23 DIAGNOSIS — I129 Hypertensive chronic kidney disease with stage 1 through stage 4 chronic kidney disease, or unspecified chronic kidney disease: Secondary | ICD-10-CM | POA: Diagnosis not present

## 2016-01-23 DIAGNOSIS — E782 Mixed hyperlipidemia: Secondary | ICD-10-CM | POA: Diagnosis not present

## 2016-03-17 DIAGNOSIS — M1712 Unilateral primary osteoarthritis, left knee: Secondary | ICD-10-CM | POA: Diagnosis not present

## 2016-03-29 DIAGNOSIS — M1712 Unilateral primary osteoarthritis, left knee: Secondary | ICD-10-CM | POA: Diagnosis not present

## 2016-04-05 DIAGNOSIS — M1712 Unilateral primary osteoarthritis, left knee: Secondary | ICD-10-CM | POA: Diagnosis not present

## 2016-04-14 DIAGNOSIS — M1712 Unilateral primary osteoarthritis, left knee: Secondary | ICD-10-CM | POA: Diagnosis not present

## 2016-05-21 DIAGNOSIS — Z1231 Encounter for screening mammogram for malignant neoplasm of breast: Secondary | ICD-10-CM | POA: Diagnosis not present

## 2016-06-04 DIAGNOSIS — M1712 Unilateral primary osteoarthritis, left knee: Secondary | ICD-10-CM | POA: Diagnosis not present

## 2016-06-07 DIAGNOSIS — M25562 Pain in left knee: Secondary | ICD-10-CM | POA: Diagnosis not present

## 2016-06-09 DIAGNOSIS — M1712 Unilateral primary osteoarthritis, left knee: Secondary | ICD-10-CM | POA: Diagnosis not present

## 2016-06-16 DIAGNOSIS — R7309 Other abnormal glucose: Secondary | ICD-10-CM | POA: Diagnosis not present

## 2016-06-16 DIAGNOSIS — E782 Mixed hyperlipidemia: Secondary | ICD-10-CM | POA: Diagnosis not present

## 2016-06-16 DIAGNOSIS — I129 Hypertensive chronic kidney disease with stage 1 through stage 4 chronic kidney disease, or unspecified chronic kidney disease: Secondary | ICD-10-CM | POA: Diagnosis not present

## 2016-06-25 DIAGNOSIS — I129 Hypertensive chronic kidney disease with stage 1 through stage 4 chronic kidney disease, or unspecified chronic kidney disease: Secondary | ICD-10-CM | POA: Diagnosis not present

## 2016-06-25 DIAGNOSIS — Z862 Personal history of diseases of the blood and blood-forming organs and certain disorders involving the immune mechanism: Secondary | ICD-10-CM | POA: Diagnosis not present

## 2016-06-25 DIAGNOSIS — N183 Chronic kidney disease, stage 3 (moderate): Secondary | ICD-10-CM | POA: Diagnosis not present

## 2016-06-25 DIAGNOSIS — E782 Mixed hyperlipidemia: Secondary | ICD-10-CM | POA: Diagnosis not present

## 2016-06-25 DIAGNOSIS — R7309 Other abnormal glucose: Secondary | ICD-10-CM | POA: Diagnosis not present

## 2016-06-25 DIAGNOSIS — Z1389 Encounter for screening for other disorder: Secondary | ICD-10-CM | POA: Diagnosis not present

## 2016-07-14 DIAGNOSIS — Z01818 Encounter for other preprocedural examination: Secondary | ICD-10-CM | POA: Diagnosis not present

## 2016-07-23 DIAGNOSIS — I10 Essential (primary) hypertension: Secondary | ICD-10-CM | POA: Diagnosis not present

## 2016-08-13 DIAGNOSIS — Z79899 Other long term (current) drug therapy: Secondary | ICD-10-CM | POA: Diagnosis not present

## 2016-08-13 DIAGNOSIS — Z01810 Encounter for preprocedural cardiovascular examination: Secondary | ICD-10-CM | POA: Diagnosis not present

## 2016-08-13 DIAGNOSIS — R52 Pain, unspecified: Secondary | ICD-10-CM | POA: Diagnosis not present

## 2016-08-13 DIAGNOSIS — Z01818 Encounter for other preprocedural examination: Secondary | ICD-10-CM | POA: Diagnosis not present

## 2016-08-13 DIAGNOSIS — M79609 Pain in unspecified limb: Secondary | ICD-10-CM | POA: Diagnosis not present

## 2016-09-06 DIAGNOSIS — M79609 Pain in unspecified limb: Secondary | ICD-10-CM | POA: Diagnosis not present

## 2016-09-06 DIAGNOSIS — R52 Pain, unspecified: Secondary | ICD-10-CM | POA: Diagnosis not present

## 2016-09-06 DIAGNOSIS — Z79899 Other long term (current) drug therapy: Secondary | ICD-10-CM | POA: Diagnosis not present

## 2016-09-06 DIAGNOSIS — Z01818 Encounter for other preprocedural examination: Secondary | ICD-10-CM | POA: Diagnosis not present

## 2016-09-13 DIAGNOSIS — M25562 Pain in left knee: Secondary | ICD-10-CM | POA: Diagnosis not present

## 2016-09-13 DIAGNOSIS — M1712 Unilateral primary osteoarthritis, left knee: Secondary | ICD-10-CM | POA: Diagnosis not present

## 2016-09-21 DIAGNOSIS — D649 Anemia, unspecified: Secondary | ICD-10-CM | POA: Diagnosis not present

## 2016-09-21 DIAGNOSIS — N183 Chronic kidney disease, stage 3 (moderate): Secondary | ICD-10-CM | POA: Diagnosis not present

## 2016-09-21 DIAGNOSIS — E876 Hypokalemia: Secondary | ICD-10-CM | POA: Diagnosis not present

## 2016-09-21 DIAGNOSIS — E7800 Pure hypercholesterolemia, unspecified: Secondary | ICD-10-CM | POA: Diagnosis not present

## 2016-09-21 DIAGNOSIS — Z79899 Other long term (current) drug therapy: Secondary | ICD-10-CM | POA: Diagnosis not present

## 2016-09-21 DIAGNOSIS — Z96652 Presence of left artificial knee joint: Secondary | ICD-10-CM | POA: Diagnosis not present

## 2016-09-21 DIAGNOSIS — Z471 Aftercare following joint replacement surgery: Secondary | ICD-10-CM | POA: Diagnosis not present

## 2016-09-21 DIAGNOSIS — J309 Allergic rhinitis, unspecified: Secondary | ICD-10-CM | POA: Diagnosis not present

## 2016-09-21 DIAGNOSIS — Z7982 Long term (current) use of aspirin: Secondary | ICD-10-CM | POA: Diagnosis not present

## 2016-09-21 DIAGNOSIS — M25562 Pain in left knee: Secondary | ICD-10-CM | POA: Diagnosis not present

## 2016-09-21 DIAGNOSIS — M1711 Unilateral primary osteoarthritis, right knee: Secondary | ICD-10-CM | POA: Diagnosis not present

## 2016-09-21 DIAGNOSIS — I10 Essential (primary) hypertension: Secondary | ICD-10-CM | POA: Diagnosis not present

## 2016-09-21 DIAGNOSIS — G8918 Other acute postprocedural pain: Secondary | ICD-10-CM | POA: Diagnosis not present

## 2016-09-21 DIAGNOSIS — M1712 Unilateral primary osteoarthritis, left knee: Secondary | ICD-10-CM | POA: Diagnosis not present

## 2016-09-21 DIAGNOSIS — K219 Gastro-esophageal reflux disease without esophagitis: Secondary | ICD-10-CM | POA: Diagnosis not present

## 2016-09-21 DIAGNOSIS — Z87891 Personal history of nicotine dependence: Secondary | ICD-10-CM | POA: Diagnosis not present

## 2016-09-21 DIAGNOSIS — I129 Hypertensive chronic kidney disease with stage 1 through stage 4 chronic kidney disease, or unspecified chronic kidney disease: Secondary | ICD-10-CM | POA: Diagnosis not present

## 2016-09-21 DIAGNOSIS — G8929 Other chronic pain: Secondary | ICD-10-CM | POA: Diagnosis not present

## 2016-09-22 DIAGNOSIS — K219 Gastro-esophageal reflux disease without esophagitis: Secondary | ICD-10-CM | POA: Diagnosis not present

## 2016-09-22 DIAGNOSIS — E876 Hypokalemia: Secondary | ICD-10-CM | POA: Diagnosis not present

## 2016-09-22 DIAGNOSIS — D649 Anemia, unspecified: Secondary | ICD-10-CM | POA: Diagnosis not present

## 2016-09-23 DIAGNOSIS — Z96652 Presence of left artificial knee joint: Secondary | ICD-10-CM | POA: Diagnosis not present

## 2016-09-23 DIAGNOSIS — Z471 Aftercare following joint replacement surgery: Secondary | ICD-10-CM | POA: Diagnosis not present

## 2016-09-23 DIAGNOSIS — M25562 Pain in left knee: Secondary | ICD-10-CM | POA: Diagnosis not present

## 2016-09-23 DIAGNOSIS — G8918 Other acute postprocedural pain: Secondary | ICD-10-CM | POA: Diagnosis not present

## 2016-09-23 DIAGNOSIS — R262 Difficulty in walking, not elsewhere classified: Secondary | ICD-10-CM | POA: Diagnosis not present

## 2016-09-23 DIAGNOSIS — D649 Anemia, unspecified: Secondary | ICD-10-CM | POA: Diagnosis not present

## 2016-09-23 DIAGNOSIS — M1712 Unilateral primary osteoarthritis, left knee: Secondary | ICD-10-CM | POA: Diagnosis not present

## 2016-09-23 DIAGNOSIS — I10 Essential (primary) hypertension: Secondary | ICD-10-CM | POA: Diagnosis not present

## 2016-09-24 DIAGNOSIS — R262 Difficulty in walking, not elsewhere classified: Secondary | ICD-10-CM | POA: Diagnosis not present

## 2016-09-24 DIAGNOSIS — Z96652 Presence of left artificial knee joint: Secondary | ICD-10-CM | POA: Diagnosis not present

## 2016-09-24 DIAGNOSIS — D649 Anemia, unspecified: Secondary | ICD-10-CM | POA: Diagnosis not present

## 2016-09-24 DIAGNOSIS — G8918 Other acute postprocedural pain: Secondary | ICD-10-CM | POA: Diagnosis not present

## 2016-10-12 DIAGNOSIS — M25562 Pain in left knee: Secondary | ICD-10-CM | POA: Diagnosis not present

## 2016-10-12 DIAGNOSIS — R262 Difficulty in walking, not elsewhere classified: Secondary | ICD-10-CM | POA: Diagnosis not present

## 2016-10-12 DIAGNOSIS — M1712 Unilateral primary osteoarthritis, left knee: Secondary | ICD-10-CM | POA: Diagnosis not present

## 2016-10-14 DIAGNOSIS — M1712 Unilateral primary osteoarthritis, left knee: Secondary | ICD-10-CM | POA: Diagnosis not present

## 2016-10-14 DIAGNOSIS — M25562 Pain in left knee: Secondary | ICD-10-CM | POA: Diagnosis not present

## 2016-10-14 DIAGNOSIS — R262 Difficulty in walking, not elsewhere classified: Secondary | ICD-10-CM | POA: Diagnosis not present

## 2016-10-20 DIAGNOSIS — M25562 Pain in left knee: Secondary | ICD-10-CM | POA: Diagnosis not present

## 2016-10-20 DIAGNOSIS — R262 Difficulty in walking, not elsewhere classified: Secondary | ICD-10-CM | POA: Diagnosis not present

## 2016-10-20 DIAGNOSIS — M1712 Unilateral primary osteoarthritis, left knee: Secondary | ICD-10-CM | POA: Diagnosis not present

## 2016-10-21 DIAGNOSIS — M1712 Unilateral primary osteoarthritis, left knee: Secondary | ICD-10-CM | POA: Diagnosis not present

## 2016-10-21 DIAGNOSIS — Z139 Encounter for screening, unspecified: Secondary | ICD-10-CM | POA: Diagnosis not present

## 2016-10-21 DIAGNOSIS — M25562 Pain in left knee: Secondary | ICD-10-CM | POA: Diagnosis not present

## 2016-10-21 DIAGNOSIS — N183 Chronic kidney disease, stage 3 (moderate): Secondary | ICD-10-CM | POA: Diagnosis not present

## 2016-10-21 DIAGNOSIS — Z0000 Encounter for general adult medical examination without abnormal findings: Secondary | ICD-10-CM | POA: Diagnosis not present

## 2016-10-21 DIAGNOSIS — Z1389 Encounter for screening for other disorder: Secondary | ICD-10-CM | POA: Diagnosis not present

## 2016-10-21 DIAGNOSIS — I129 Hypertensive chronic kidney disease with stage 1 through stage 4 chronic kidney disease, or unspecified chronic kidney disease: Secondary | ICD-10-CM | POA: Diagnosis not present

## 2016-10-21 DIAGNOSIS — R262 Difficulty in walking, not elsewhere classified: Secondary | ICD-10-CM | POA: Diagnosis not present

## 2016-10-25 DIAGNOSIS — R262 Difficulty in walking, not elsewhere classified: Secondary | ICD-10-CM | POA: Diagnosis not present

## 2016-10-25 DIAGNOSIS — M25562 Pain in left knee: Secondary | ICD-10-CM | POA: Diagnosis not present

## 2016-10-25 DIAGNOSIS — M1712 Unilateral primary osteoarthritis, left knee: Secondary | ICD-10-CM | POA: Diagnosis not present

## 2016-10-28 DIAGNOSIS — M1712 Unilateral primary osteoarthritis, left knee: Secondary | ICD-10-CM | POA: Diagnosis not present

## 2016-10-28 DIAGNOSIS — I129 Hypertensive chronic kidney disease with stage 1 through stage 4 chronic kidney disease, or unspecified chronic kidney disease: Secondary | ICD-10-CM | POA: Diagnosis not present

## 2016-10-28 DIAGNOSIS — E782 Mixed hyperlipidemia: Secondary | ICD-10-CM | POA: Diagnosis not present

## 2016-10-28 DIAGNOSIS — R7309 Other abnormal glucose: Secondary | ICD-10-CM | POA: Diagnosis not present

## 2016-10-28 DIAGNOSIS — M25562 Pain in left knee: Secondary | ICD-10-CM | POA: Diagnosis not present

## 2016-10-28 DIAGNOSIS — R262 Difficulty in walking, not elsewhere classified: Secondary | ICD-10-CM | POA: Diagnosis not present

## 2016-11-01 DIAGNOSIS — M25562 Pain in left knee: Secondary | ICD-10-CM | POA: Diagnosis not present

## 2016-11-01 DIAGNOSIS — R262 Difficulty in walking, not elsewhere classified: Secondary | ICD-10-CM | POA: Diagnosis not present

## 2016-11-01 DIAGNOSIS — M1712 Unilateral primary osteoarthritis, left knee: Secondary | ICD-10-CM | POA: Diagnosis not present

## 2016-11-01 DIAGNOSIS — Z96652 Presence of left artificial knee joint: Secondary | ICD-10-CM | POA: Diagnosis not present

## 2016-11-04 DIAGNOSIS — I129 Hypertensive chronic kidney disease with stage 1 through stage 4 chronic kidney disease, or unspecified chronic kidney disease: Secondary | ICD-10-CM | POA: Diagnosis not present

## 2016-11-04 DIAGNOSIS — N183 Chronic kidney disease, stage 3 (moderate): Secondary | ICD-10-CM | POA: Diagnosis not present

## 2016-11-04 DIAGNOSIS — E782 Mixed hyperlipidemia: Secondary | ICD-10-CM | POA: Diagnosis not present

## 2016-11-08 DIAGNOSIS — R262 Difficulty in walking, not elsewhere classified: Secondary | ICD-10-CM | POA: Diagnosis not present

## 2016-11-08 DIAGNOSIS — M1712 Unilateral primary osteoarthritis, left knee: Secondary | ICD-10-CM | POA: Diagnosis not present

## 2016-11-08 DIAGNOSIS — M25562 Pain in left knee: Secondary | ICD-10-CM | POA: Diagnosis not present

## 2017-02-02 DIAGNOSIS — R7303 Prediabetes: Secondary | ICD-10-CM | POA: Diagnosis not present

## 2017-02-02 DIAGNOSIS — I129 Hypertensive chronic kidney disease with stage 1 through stage 4 chronic kidney disease, or unspecified chronic kidney disease: Secondary | ICD-10-CM | POA: Diagnosis not present

## 2017-02-02 DIAGNOSIS — E782 Mixed hyperlipidemia: Secondary | ICD-10-CM | POA: Diagnosis not present

## 2017-02-14 DIAGNOSIS — Z139 Encounter for screening, unspecified: Secondary | ICD-10-CM | POA: Diagnosis not present

## 2017-02-14 DIAGNOSIS — I129 Hypertensive chronic kidney disease with stage 1 through stage 4 chronic kidney disease, or unspecified chronic kidney disease: Secondary | ICD-10-CM | POA: Diagnosis not present

## 2017-02-14 DIAGNOSIS — Z1389 Encounter for screening for other disorder: Secondary | ICD-10-CM | POA: Diagnosis not present

## 2017-02-14 DIAGNOSIS — N183 Chronic kidney disease, stage 3 (moderate): Secondary | ICD-10-CM | POA: Diagnosis not present

## 2017-02-14 DIAGNOSIS — E782 Mixed hyperlipidemia: Secondary | ICD-10-CM | POA: Diagnosis not present

## 2017-03-18 DIAGNOSIS — M1712 Unilateral primary osteoarthritis, left knee: Secondary | ICD-10-CM | POA: Diagnosis not present

## 2017-03-18 DIAGNOSIS — Z96652 Presence of left artificial knee joint: Secondary | ICD-10-CM | POA: Diagnosis not present

## 2017-03-23 DIAGNOSIS — H5203 Hypermetropia, bilateral: Secondary | ICD-10-CM | POA: Diagnosis not present

## 2017-03-23 DIAGNOSIS — H43813 Vitreous degeneration, bilateral: Secondary | ICD-10-CM | POA: Diagnosis not present

## 2017-06-13 DIAGNOSIS — E782 Mixed hyperlipidemia: Secondary | ICD-10-CM | POA: Diagnosis not present

## 2017-06-13 DIAGNOSIS — R7303 Prediabetes: Secondary | ICD-10-CM | POA: Diagnosis not present

## 2017-06-13 DIAGNOSIS — I129 Hypertensive chronic kidney disease with stage 1 through stage 4 chronic kidney disease, or unspecified chronic kidney disease: Secondary | ICD-10-CM | POA: Diagnosis not present

## 2017-06-20 DIAGNOSIS — I129 Hypertensive chronic kidney disease with stage 1 through stage 4 chronic kidney disease, or unspecified chronic kidney disease: Secondary | ICD-10-CM | POA: Diagnosis not present

## 2017-06-20 DIAGNOSIS — R7303 Prediabetes: Secondary | ICD-10-CM | POA: Diagnosis not present

## 2017-06-20 DIAGNOSIS — N183 Chronic kidney disease, stage 3 (moderate): Secondary | ICD-10-CM | POA: Diagnosis not present

## 2017-06-20 DIAGNOSIS — Z23 Encounter for immunization: Secondary | ICD-10-CM | POA: Diagnosis not present

## 2017-06-20 DIAGNOSIS — Z139 Encounter for screening, unspecified: Secondary | ICD-10-CM | POA: Diagnosis not present

## 2017-06-20 DIAGNOSIS — E782 Mixed hyperlipidemia: Secondary | ICD-10-CM | POA: Diagnosis not present

## 2017-06-21 DIAGNOSIS — R110 Nausea: Secondary | ICD-10-CM | POA: Diagnosis not present

## 2017-06-21 DIAGNOSIS — R197 Diarrhea, unspecified: Secondary | ICD-10-CM | POA: Diagnosis not present

## 2017-06-21 DIAGNOSIS — Z86010 Personal history of colonic polyps: Secondary | ICD-10-CM | POA: Diagnosis not present

## 2017-06-24 DIAGNOSIS — J449 Chronic obstructive pulmonary disease, unspecified: Secondary | ICD-10-CM | POA: Diagnosis not present

## 2017-06-28 DIAGNOSIS — J3089 Other allergic rhinitis: Secondary | ICD-10-CM | POA: Diagnosis not present

## 2017-06-29 DIAGNOSIS — J301 Allergic rhinitis due to pollen: Secondary | ICD-10-CM | POA: Diagnosis not present

## 2017-06-29 DIAGNOSIS — J3089 Other allergic rhinitis: Secondary | ICD-10-CM | POA: Diagnosis not present

## 2017-06-29 DIAGNOSIS — J3081 Allergic rhinitis due to animal (cat) (dog) hair and dander: Secondary | ICD-10-CM | POA: Diagnosis not present

## 2017-07-01 DIAGNOSIS — J3081 Allergic rhinitis due to animal (cat) (dog) hair and dander: Secondary | ICD-10-CM | POA: Diagnosis not present

## 2017-07-01 DIAGNOSIS — J301 Allergic rhinitis due to pollen: Secondary | ICD-10-CM | POA: Diagnosis not present

## 2017-07-01 DIAGNOSIS — J3089 Other allergic rhinitis: Secondary | ICD-10-CM | POA: Diagnosis not present

## 2017-07-04 DIAGNOSIS — J3081 Allergic rhinitis due to animal (cat) (dog) hair and dander: Secondary | ICD-10-CM | POA: Diagnosis not present

## 2017-07-04 DIAGNOSIS — J301 Allergic rhinitis due to pollen: Secondary | ICD-10-CM | POA: Diagnosis not present

## 2017-07-04 DIAGNOSIS — J3089 Other allergic rhinitis: Secondary | ICD-10-CM | POA: Diagnosis not present

## 2017-07-05 DIAGNOSIS — J3089 Other allergic rhinitis: Secondary | ICD-10-CM | POA: Diagnosis not present

## 2017-07-05 DIAGNOSIS — J3081 Allergic rhinitis due to animal (cat) (dog) hair and dander: Secondary | ICD-10-CM | POA: Diagnosis not present

## 2017-07-05 DIAGNOSIS — J301 Allergic rhinitis due to pollen: Secondary | ICD-10-CM | POA: Diagnosis not present

## 2017-07-06 DIAGNOSIS — J301 Allergic rhinitis due to pollen: Secondary | ICD-10-CM | POA: Diagnosis not present

## 2017-07-06 DIAGNOSIS — J3081 Allergic rhinitis due to animal (cat) (dog) hair and dander: Secondary | ICD-10-CM | POA: Diagnosis not present

## 2017-07-06 DIAGNOSIS — J3089 Other allergic rhinitis: Secondary | ICD-10-CM | POA: Diagnosis not present

## 2017-07-07 DIAGNOSIS — J301 Allergic rhinitis due to pollen: Secondary | ICD-10-CM | POA: Diagnosis not present

## 2017-07-07 DIAGNOSIS — J3089 Other allergic rhinitis: Secondary | ICD-10-CM | POA: Diagnosis not present

## 2017-07-07 DIAGNOSIS — J3081 Allergic rhinitis due to animal (cat) (dog) hair and dander: Secondary | ICD-10-CM | POA: Diagnosis not present

## 2017-07-08 DIAGNOSIS — J3081 Allergic rhinitis due to animal (cat) (dog) hair and dander: Secondary | ICD-10-CM | POA: Diagnosis not present

## 2017-07-08 DIAGNOSIS — J301 Allergic rhinitis due to pollen: Secondary | ICD-10-CM | POA: Diagnosis not present

## 2017-07-08 DIAGNOSIS — J3089 Other allergic rhinitis: Secondary | ICD-10-CM | POA: Diagnosis not present

## 2017-07-11 DIAGNOSIS — K6389 Other specified diseases of intestine: Secondary | ICD-10-CM | POA: Diagnosis not present

## 2017-07-11 DIAGNOSIS — I10 Essential (primary) hypertension: Secondary | ICD-10-CM | POA: Diagnosis not present

## 2017-07-11 DIAGNOSIS — R197 Diarrhea, unspecified: Secondary | ICD-10-CM | POA: Diagnosis not present

## 2017-07-11 DIAGNOSIS — Z79899 Other long term (current) drug therapy: Secondary | ICD-10-CM | POA: Diagnosis not present

## 2017-07-11 DIAGNOSIS — D124 Benign neoplasm of descending colon: Secondary | ICD-10-CM | POA: Diagnosis not present

## 2017-07-11 DIAGNOSIS — K635 Polyp of colon: Secondary | ICD-10-CM | POA: Diagnosis not present

## 2017-07-11 DIAGNOSIS — E7800 Pure hypercholesterolemia, unspecified: Secondary | ICD-10-CM | POA: Diagnosis not present

## 2017-07-11 DIAGNOSIS — Z79891 Long term (current) use of opiate analgesic: Secondary | ICD-10-CM | POA: Diagnosis not present

## 2017-07-11 DIAGNOSIS — K5190 Ulcerative colitis, unspecified, without complications: Secondary | ICD-10-CM | POA: Diagnosis not present

## 2017-07-11 DIAGNOSIS — K219 Gastro-esophageal reflux disease without esophagitis: Secondary | ICD-10-CM | POA: Diagnosis not present

## 2017-07-11 DIAGNOSIS — K648 Other hemorrhoids: Secondary | ICD-10-CM | POA: Diagnosis not present

## 2017-07-11 DIAGNOSIS — Z86010 Personal history of colonic polyps: Secondary | ICD-10-CM | POA: Diagnosis not present

## 2017-07-11 DIAGNOSIS — M1990 Unspecified osteoarthritis, unspecified site: Secondary | ICD-10-CM | POA: Diagnosis not present

## 2017-07-11 DIAGNOSIS — K589 Irritable bowel syndrome without diarrhea: Secondary | ICD-10-CM | POA: Diagnosis not present

## 2017-07-11 DIAGNOSIS — K5730 Diverticulosis of large intestine without perforation or abscess without bleeding: Secondary | ICD-10-CM | POA: Diagnosis not present

## 2017-07-20 DIAGNOSIS — R091 Pleurisy: Secondary | ICD-10-CM | POA: Diagnosis not present

## 2017-07-21 DIAGNOSIS — Z780 Asymptomatic menopausal state: Secondary | ICD-10-CM | POA: Diagnosis not present

## 2017-07-21 DIAGNOSIS — M8580 Other specified disorders of bone density and structure, unspecified site: Secondary | ICD-10-CM | POA: Diagnosis not present

## 2017-07-21 DIAGNOSIS — M8589 Other specified disorders of bone density and structure, multiple sites: Secondary | ICD-10-CM | POA: Diagnosis not present

## 2017-07-26 DIAGNOSIS — Z1231 Encounter for screening mammogram for malignant neoplasm of breast: Secondary | ICD-10-CM | POA: Diagnosis not present

## 2017-07-29 DIAGNOSIS — J449 Chronic obstructive pulmonary disease, unspecified: Secondary | ICD-10-CM | POA: Diagnosis not present

## 2017-08-12 DIAGNOSIS — Z1331 Encounter for screening for depression: Secondary | ICD-10-CM | POA: Diagnosis not present

## 2017-08-12 DIAGNOSIS — M8580 Other specified disorders of bone density and structure, unspecified site: Secondary | ICD-10-CM | POA: Diagnosis not present

## 2017-08-12 DIAGNOSIS — Z139 Encounter for screening, unspecified: Secondary | ICD-10-CM | POA: Diagnosis not present

## 2017-08-12 DIAGNOSIS — E638 Other specified nutritional deficiencies: Secondary | ICD-10-CM | POA: Diagnosis not present

## 2017-09-15 DIAGNOSIS — J449 Chronic obstructive pulmonary disease, unspecified: Secondary | ICD-10-CM | POA: Diagnosis not present

## 2017-09-19 DIAGNOSIS — M12811 Other specific arthropathies, not elsewhere classified, right shoulder: Secondary | ICD-10-CM | POA: Diagnosis not present

## 2017-09-19 DIAGNOSIS — M1712 Unilateral primary osteoarthritis, left knee: Secondary | ICD-10-CM | POA: Diagnosis not present

## 2017-09-19 DIAGNOSIS — Z96652 Presence of left artificial knee joint: Secondary | ICD-10-CM | POA: Diagnosis not present

## 2017-10-13 DIAGNOSIS — J449 Chronic obstructive pulmonary disease, unspecified: Secondary | ICD-10-CM | POA: Diagnosis not present

## 2017-10-18 DIAGNOSIS — R42 Dizziness and giddiness: Secondary | ICD-10-CM | POA: Diagnosis not present

## 2017-10-18 DIAGNOSIS — H6091 Unspecified otitis externa, right ear: Secondary | ICD-10-CM | POA: Diagnosis not present

## 2017-10-27 DIAGNOSIS — Z789 Other specified health status: Secondary | ICD-10-CM | POA: Diagnosis not present

## 2017-10-27 DIAGNOSIS — Z139 Encounter for screening, unspecified: Secondary | ICD-10-CM | POA: Diagnosis not present

## 2017-10-27 DIAGNOSIS — J449 Chronic obstructive pulmonary disease, unspecified: Secondary | ICD-10-CM | POA: Diagnosis not present

## 2017-10-27 DIAGNOSIS — H8110 Benign paroxysmal vertigo, unspecified ear: Secondary | ICD-10-CM | POA: Diagnosis not present

## 2017-10-27 DIAGNOSIS — Z0000 Encounter for general adult medical examination without abnormal findings: Secondary | ICD-10-CM | POA: Diagnosis not present

## 2017-11-03 DIAGNOSIS — J449 Chronic obstructive pulmonary disease, unspecified: Secondary | ICD-10-CM | POA: Diagnosis not present

## 2017-11-24 DIAGNOSIS — J449 Chronic obstructive pulmonary disease, unspecified: Secondary | ICD-10-CM | POA: Diagnosis not present

## 2017-11-25 DIAGNOSIS — I129 Hypertensive chronic kidney disease with stage 1 through stage 4 chronic kidney disease, or unspecified chronic kidney disease: Secondary | ICD-10-CM | POA: Diagnosis not present

## 2017-11-25 DIAGNOSIS — R7303 Prediabetes: Secondary | ICD-10-CM | POA: Diagnosis not present

## 2017-11-25 DIAGNOSIS — E782 Mixed hyperlipidemia: Secondary | ICD-10-CM | POA: Diagnosis not present

## 2017-11-30 DIAGNOSIS — I129 Hypertensive chronic kidney disease with stage 1 through stage 4 chronic kidney disease, or unspecified chronic kidney disease: Secondary | ICD-10-CM | POA: Diagnosis not present

## 2017-11-30 DIAGNOSIS — E782 Mixed hyperlipidemia: Secondary | ICD-10-CM | POA: Diagnosis not present

## 2017-12-02 DIAGNOSIS — R7301 Impaired fasting glucose: Secondary | ICD-10-CM | POA: Diagnosis not present

## 2017-12-02 DIAGNOSIS — I129 Hypertensive chronic kidney disease with stage 1 through stage 4 chronic kidney disease, or unspecified chronic kidney disease: Secondary | ICD-10-CM | POA: Diagnosis not present

## 2017-12-02 DIAGNOSIS — E782 Mixed hyperlipidemia: Secondary | ICD-10-CM | POA: Diagnosis not present

## 2017-12-02 DIAGNOSIS — N183 Chronic kidney disease, stage 3 (moderate): Secondary | ICD-10-CM | POA: Diagnosis not present

## 2017-12-07 DIAGNOSIS — E782 Mixed hyperlipidemia: Secondary | ICD-10-CM | POA: Diagnosis not present

## 2017-12-14 DIAGNOSIS — N183 Chronic kidney disease, stage 3 (moderate): Secondary | ICD-10-CM | POA: Diagnosis not present

## 2017-12-14 DIAGNOSIS — E782 Mixed hyperlipidemia: Secondary | ICD-10-CM | POA: Diagnosis not present

## 2017-12-21 DIAGNOSIS — N183 Chronic kidney disease, stage 3 (moderate): Secondary | ICD-10-CM | POA: Diagnosis not present

## 2017-12-21 DIAGNOSIS — E782 Mixed hyperlipidemia: Secondary | ICD-10-CM | POA: Diagnosis not present

## 2017-12-28 DIAGNOSIS — N183 Chronic kidney disease, stage 3 (moderate): Secondary | ICD-10-CM | POA: Diagnosis not present

## 2017-12-28 DIAGNOSIS — E782 Mixed hyperlipidemia: Secondary | ICD-10-CM | POA: Diagnosis not present

## 2018-01-04 DIAGNOSIS — E782 Mixed hyperlipidemia: Secondary | ICD-10-CM | POA: Diagnosis not present

## 2018-01-04 DIAGNOSIS — N183 Chronic kidney disease, stage 3 (moderate): Secondary | ICD-10-CM | POA: Diagnosis not present

## 2018-01-11 DIAGNOSIS — E782 Mixed hyperlipidemia: Secondary | ICD-10-CM | POA: Diagnosis not present

## 2018-01-18 DIAGNOSIS — E782 Mixed hyperlipidemia: Secondary | ICD-10-CM | POA: Diagnosis not present

## 2018-01-25 DIAGNOSIS — N183 Chronic kidney disease, stage 3 (moderate): Secondary | ICD-10-CM | POA: Diagnosis not present

## 2018-01-25 DIAGNOSIS — E782 Mixed hyperlipidemia: Secondary | ICD-10-CM | POA: Diagnosis not present

## 2018-02-01 DIAGNOSIS — E782 Mixed hyperlipidemia: Secondary | ICD-10-CM | POA: Diagnosis not present

## 2018-02-01 DIAGNOSIS — N183 Chronic kidney disease, stage 3 (moderate): Secondary | ICD-10-CM | POA: Diagnosis not present

## 2018-02-05 DIAGNOSIS — N183 Chronic kidney disease, stage 3 (moderate): Secondary | ICD-10-CM | POA: Diagnosis not present

## 2018-02-05 DIAGNOSIS — E782 Mixed hyperlipidemia: Secondary | ICD-10-CM | POA: Diagnosis not present

## 2018-02-08 DIAGNOSIS — N183 Chronic kidney disease, stage 3 (moderate): Secondary | ICD-10-CM | POA: Diagnosis not present

## 2018-02-08 DIAGNOSIS — E782 Mixed hyperlipidemia: Secondary | ICD-10-CM | POA: Diagnosis not present

## 2018-02-15 DIAGNOSIS — E782 Mixed hyperlipidemia: Secondary | ICD-10-CM | POA: Diagnosis not present

## 2018-02-15 DIAGNOSIS — N183 Chronic kidney disease, stage 3 (moderate): Secondary | ICD-10-CM | POA: Diagnosis not present

## 2018-03-08 DIAGNOSIS — E782 Mixed hyperlipidemia: Secondary | ICD-10-CM | POA: Diagnosis not present

## 2018-03-08 DIAGNOSIS — N183 Chronic kidney disease, stage 3 (moderate): Secondary | ICD-10-CM | POA: Diagnosis not present

## 2018-03-08 DIAGNOSIS — I129 Hypertensive chronic kidney disease with stage 1 through stage 4 chronic kidney disease, or unspecified chronic kidney disease: Secondary | ICD-10-CM | POA: Diagnosis not present

## 2018-03-24 DIAGNOSIS — E782 Mixed hyperlipidemia: Secondary | ICD-10-CM | POA: Diagnosis not present

## 2018-03-24 DIAGNOSIS — I129 Hypertensive chronic kidney disease with stage 1 through stage 4 chronic kidney disease, or unspecified chronic kidney disease: Secondary | ICD-10-CM | POA: Diagnosis not present

## 2018-03-24 DIAGNOSIS — R7303 Prediabetes: Secondary | ICD-10-CM | POA: Diagnosis not present

## 2018-04-03 DIAGNOSIS — I129 Hypertensive chronic kidney disease with stage 1 through stage 4 chronic kidney disease, or unspecified chronic kidney disease: Secondary | ICD-10-CM | POA: Diagnosis not present

## 2018-04-03 DIAGNOSIS — H43813 Vitreous degeneration, bilateral: Secondary | ICD-10-CM | POA: Diagnosis not present

## 2018-04-03 DIAGNOSIS — H524 Presbyopia: Secondary | ICD-10-CM | POA: Diagnosis not present

## 2018-04-03 DIAGNOSIS — N183 Chronic kidney disease, stage 3 (moderate): Secondary | ICD-10-CM | POA: Diagnosis not present

## 2018-04-03 DIAGNOSIS — E782 Mixed hyperlipidemia: Secondary | ICD-10-CM | POA: Diagnosis not present

## 2018-04-03 DIAGNOSIS — R7303 Prediabetes: Secondary | ICD-10-CM | POA: Diagnosis not present

## 2018-04-03 DIAGNOSIS — Z139 Encounter for screening, unspecified: Secondary | ICD-10-CM | POA: Diagnosis not present

## 2018-04-07 DIAGNOSIS — E782 Mixed hyperlipidemia: Secondary | ICD-10-CM | POA: Diagnosis not present

## 2018-04-07 DIAGNOSIS — I129 Hypertensive chronic kidney disease with stage 1 through stage 4 chronic kidney disease, or unspecified chronic kidney disease: Secondary | ICD-10-CM | POA: Diagnosis not present

## 2018-04-07 DIAGNOSIS — N183 Chronic kidney disease, stage 3 (moderate): Secondary | ICD-10-CM | POA: Diagnosis not present

## 2018-04-25 ENCOUNTER — Other Ambulatory Visit: Payer: Self-pay

## 2018-04-25 DIAGNOSIS — I872 Venous insufficiency (chronic) (peripheral): Secondary | ICD-10-CM

## 2018-05-01 DIAGNOSIS — S7002XA Contusion of left hip, initial encounter: Secondary | ICD-10-CM | POA: Diagnosis not present

## 2018-05-01 DIAGNOSIS — S79912A Unspecified injury of left hip, initial encounter: Secondary | ICD-10-CM | POA: Diagnosis not present

## 2018-05-01 DIAGNOSIS — S50312A Abrasion of left elbow, initial encounter: Secondary | ICD-10-CM | POA: Diagnosis not present

## 2018-05-03 ENCOUNTER — Encounter (HOSPITAL_COMMUNITY): Payer: Medicare Other

## 2018-05-03 ENCOUNTER — Encounter: Payer: Medicare Other | Admitting: Vascular Surgery

## 2018-05-08 DIAGNOSIS — N183 Chronic kidney disease, stage 3 (moderate): Secondary | ICD-10-CM | POA: Diagnosis not present

## 2018-05-08 DIAGNOSIS — E782 Mixed hyperlipidemia: Secondary | ICD-10-CM | POA: Diagnosis not present

## 2018-05-08 DIAGNOSIS — I129 Hypertensive chronic kidney disease with stage 1 through stage 4 chronic kidney disease, or unspecified chronic kidney disease: Secondary | ICD-10-CM | POA: Diagnosis not present

## 2018-05-09 ENCOUNTER — Other Ambulatory Visit: Payer: Self-pay

## 2018-05-09 NOTE — Patient Outreach (Signed)
[facility]) Care Management  05/09/2018  SYMPHONIE SCHNEIDERMAN March 02, 1946 [id number]   Medication Adherence call to Mrs. Cimberly Stoffel spoke with patient she said she pick up all her medication but, Telmisartan 20 mg was one she did not received , call [facility]  they said medication is on back order and are waiting for doctor call back.Call  doctors office an left a message. Mrs. Bohne is showing past due under [place].   [place] Management Direct Dial [phone]  Fax [phone] [email or web address]

## 2018-05-12 DIAGNOSIS — M1990 Unspecified osteoarthritis, unspecified site: Secondary | ICD-10-CM | POA: Diagnosis not present

## 2018-05-12 DIAGNOSIS — I83811 Varicose veins of right lower extremities with pain: Secondary | ICD-10-CM | POA: Diagnosis not present

## 2018-05-12 DIAGNOSIS — I10 Essential (primary) hypertension: Secondary | ICD-10-CM | POA: Diagnosis not present

## 2018-05-12 DIAGNOSIS — I82812 Embolism and thrombosis of superficial veins of left lower extremities: Secondary | ICD-10-CM | POA: Diagnosis not present

## 2018-05-12 DIAGNOSIS — E7800 Pure hypercholesterolemia, unspecified: Secondary | ICD-10-CM | POA: Diagnosis not present

## 2018-05-12 DIAGNOSIS — I872 Venous insufficiency (chronic) (peripheral): Secondary | ICD-10-CM | POA: Diagnosis not present

## 2018-05-12 DIAGNOSIS — I8393 Asymptomatic varicose veins of bilateral lower extremities: Secondary | ICD-10-CM | POA: Diagnosis not present

## 2018-05-15 DIAGNOSIS — N183 Chronic kidney disease, stage 3 (moderate): Secondary | ICD-10-CM | POA: Diagnosis not present

## 2018-05-15 DIAGNOSIS — W19XXXA Unspecified fall, initial encounter: Secondary | ICD-10-CM | POA: Diagnosis not present

## 2018-05-15 DIAGNOSIS — M25552 Pain in left hip: Secondary | ICD-10-CM | POA: Diagnosis not present

## 2018-05-15 DIAGNOSIS — I129 Hypertensive chronic kidney disease with stage 1 through stage 4 chronic kidney disease, or unspecified chronic kidney disease: Secondary | ICD-10-CM | POA: Diagnosis not present

## 2018-05-16 DIAGNOSIS — S32592A Other specified fracture of left pubis, initial encounter for closed fracture: Secondary | ICD-10-CM | POA: Diagnosis not present

## 2018-05-16 DIAGNOSIS — S79912A Unspecified injury of left hip, initial encounter: Secondary | ICD-10-CM | POA: Diagnosis not present

## 2018-05-16 DIAGNOSIS — M25552 Pain in left hip: Secondary | ICD-10-CM | POA: Diagnosis not present

## 2018-05-18 DIAGNOSIS — S32599A Other specified fracture of unspecified pubis, initial encounter for closed fracture: Secondary | ICD-10-CM | POA: Diagnosis not present

## 2018-05-30 DIAGNOSIS — S32599A Other specified fracture of unspecified pubis, initial encounter for closed fracture: Secondary | ICD-10-CM | POA: Diagnosis not present

## 2018-06-08 DIAGNOSIS — I129 Hypertensive chronic kidney disease with stage 1 through stage 4 chronic kidney disease, or unspecified chronic kidney disease: Secondary | ICD-10-CM | POA: Diagnosis not present

## 2018-06-08 DIAGNOSIS — E782 Mixed hyperlipidemia: Secondary | ICD-10-CM | POA: Diagnosis not present

## 2018-06-08 DIAGNOSIS — N183 Chronic kidney disease, stage 3 (moderate): Secondary | ICD-10-CM | POA: Diagnosis not present

## 2018-06-29 DIAGNOSIS — S32599A Other specified fracture of unspecified pubis, initial encounter for closed fracture: Secondary | ICD-10-CM | POA: Diagnosis not present

## 2018-07-08 DIAGNOSIS — N183 Chronic kidney disease, stage 3 (moderate): Secondary | ICD-10-CM | POA: Diagnosis not present

## 2018-07-08 DIAGNOSIS — M5416 Radiculopathy, lumbar region: Secondary | ICD-10-CM | POA: Diagnosis not present

## 2018-07-08 DIAGNOSIS — M5127 Other intervertebral disc displacement, lumbosacral region: Secondary | ICD-10-CM | POA: Diagnosis not present

## 2018-07-08 DIAGNOSIS — I129 Hypertensive chronic kidney disease with stage 1 through stage 4 chronic kidney disease, or unspecified chronic kidney disease: Secondary | ICD-10-CM | POA: Diagnosis not present

## 2018-07-08 DIAGNOSIS — E782 Mixed hyperlipidemia: Secondary | ICD-10-CM | POA: Diagnosis not present

## 2018-07-11 DIAGNOSIS — S32599A Other specified fracture of unspecified pubis, initial encounter for closed fracture: Secondary | ICD-10-CM | POA: Diagnosis not present

## 2018-07-11 DIAGNOSIS — S3210XA Unspecified fracture of sacrum, initial encounter for closed fracture: Secondary | ICD-10-CM | POA: Diagnosis not present

## 2018-07-14 ENCOUNTER — Encounter (HOSPITAL_COMMUNITY): Payer: Medicare Other

## 2018-07-14 ENCOUNTER — Encounter: Payer: Medicare Other | Admitting: Vascular Surgery

## 2018-07-21 DIAGNOSIS — R7303 Prediabetes: Secondary | ICD-10-CM | POA: Diagnosis not present

## 2018-07-21 DIAGNOSIS — E782 Mixed hyperlipidemia: Secondary | ICD-10-CM | POA: Diagnosis not present

## 2018-07-21 DIAGNOSIS — I129 Hypertensive chronic kidney disease with stage 1 through stage 4 chronic kidney disease, or unspecified chronic kidney disease: Secondary | ICD-10-CM | POA: Diagnosis not present

## 2018-07-27 DIAGNOSIS — E782 Mixed hyperlipidemia: Secondary | ICD-10-CM | POA: Diagnosis not present

## 2018-07-27 DIAGNOSIS — N183 Chronic kidney disease, stage 3 (moderate): Secondary | ICD-10-CM | POA: Diagnosis not present

## 2018-07-27 DIAGNOSIS — Z139 Encounter for screening, unspecified: Secondary | ICD-10-CM | POA: Diagnosis not present

## 2018-07-27 DIAGNOSIS — I129 Hypertensive chronic kidney disease with stage 1 through stage 4 chronic kidney disease, or unspecified chronic kidney disease: Secondary | ICD-10-CM | POA: Diagnosis not present

## 2018-07-27 DIAGNOSIS — R7303 Prediabetes: Secondary | ICD-10-CM | POA: Diagnosis not present

## 2018-08-08 DIAGNOSIS — I129 Hypertensive chronic kidney disease with stage 1 through stage 4 chronic kidney disease, or unspecified chronic kidney disease: Secondary | ICD-10-CM | POA: Diagnosis not present

## 2018-08-08 DIAGNOSIS — E782 Mixed hyperlipidemia: Secondary | ICD-10-CM | POA: Diagnosis not present

## 2018-08-08 DIAGNOSIS — R7303 Prediabetes: Secondary | ICD-10-CM | POA: Diagnosis not present

## 2018-08-08 DIAGNOSIS — N183 Chronic kidney disease, stage 3 (moderate): Secondary | ICD-10-CM | POA: Diagnosis not present

## 2018-08-30 DIAGNOSIS — Z1231 Encounter for screening mammogram for malignant neoplasm of breast: Secondary | ICD-10-CM | POA: Diagnosis not present

## 2018-09-08 DIAGNOSIS — E782 Mixed hyperlipidemia: Secondary | ICD-10-CM | POA: Diagnosis not present

## 2018-09-08 DIAGNOSIS — R7303 Prediabetes: Secondary | ICD-10-CM | POA: Diagnosis not present

## 2018-09-08 DIAGNOSIS — N183 Chronic kidney disease, stage 3 (moderate): Secondary | ICD-10-CM | POA: Diagnosis not present

## 2018-09-08 DIAGNOSIS — I129 Hypertensive chronic kidney disease with stage 1 through stage 4 chronic kidney disease, or unspecified chronic kidney disease: Secondary | ICD-10-CM | POA: Diagnosis not present

## 2018-09-19 DIAGNOSIS — S32599A Other specified fracture of unspecified pubis, initial encounter for closed fracture: Secondary | ICD-10-CM | POA: Diagnosis not present

## 2018-09-19 DIAGNOSIS — S3210XA Unspecified fracture of sacrum, initial encounter for closed fracture: Secondary | ICD-10-CM | POA: Diagnosis not present

## 2018-10-06 DIAGNOSIS — R7303 Prediabetes: Secondary | ICD-10-CM | POA: Diagnosis not present

## 2018-10-06 DIAGNOSIS — E782 Mixed hyperlipidemia: Secondary | ICD-10-CM | POA: Diagnosis not present

## 2018-10-06 DIAGNOSIS — I129 Hypertensive chronic kidney disease with stage 1 through stage 4 chronic kidney disease, or unspecified chronic kidney disease: Secondary | ICD-10-CM | POA: Diagnosis not present

## 2018-10-06 DIAGNOSIS — N183 Chronic kidney disease, stage 3 (moderate): Secondary | ICD-10-CM | POA: Diagnosis not present

## 2018-11-07 DIAGNOSIS — I129 Hypertensive chronic kidney disease with stage 1 through stage 4 chronic kidney disease, or unspecified chronic kidney disease: Secondary | ICD-10-CM | POA: Diagnosis not present

## 2018-11-07 DIAGNOSIS — E782 Mixed hyperlipidemia: Secondary | ICD-10-CM | POA: Diagnosis not present

## 2018-11-07 DIAGNOSIS — N183 Chronic kidney disease, stage 3 (moderate): Secondary | ICD-10-CM | POA: Diagnosis not present

## 2018-11-07 DIAGNOSIS — R7303 Prediabetes: Secondary | ICD-10-CM | POA: Diagnosis not present

## 2018-11-17 DIAGNOSIS — Z0000 Encounter for general adult medical examination without abnormal findings: Secondary | ICD-10-CM | POA: Diagnosis not present

## 2018-11-23 DIAGNOSIS — E782 Mixed hyperlipidemia: Secondary | ICD-10-CM | POA: Diagnosis not present

## 2018-11-23 DIAGNOSIS — I129 Hypertensive chronic kidney disease with stage 1 through stage 4 chronic kidney disease, or unspecified chronic kidney disease: Secondary | ICD-10-CM | POA: Diagnosis not present

## 2018-11-23 DIAGNOSIS — R7303 Prediabetes: Secondary | ICD-10-CM | POA: Diagnosis not present

## 2018-11-30 DIAGNOSIS — E782 Mixed hyperlipidemia: Secondary | ICD-10-CM | POA: Diagnosis not present

## 2018-11-30 DIAGNOSIS — I129 Hypertensive chronic kidney disease with stage 1 through stage 4 chronic kidney disease, or unspecified chronic kidney disease: Secondary | ICD-10-CM | POA: Diagnosis not present

## 2018-11-30 DIAGNOSIS — R7303 Prediabetes: Secondary | ICD-10-CM | POA: Diagnosis not present

## 2018-11-30 DIAGNOSIS — N183 Chronic kidney disease, stage 3 (moderate): Secondary | ICD-10-CM | POA: Diagnosis not present

## 2018-12-07 DIAGNOSIS — R7303 Prediabetes: Secondary | ICD-10-CM | POA: Diagnosis not present

## 2018-12-07 DIAGNOSIS — I129 Hypertensive chronic kidney disease with stage 1 through stage 4 chronic kidney disease, or unspecified chronic kidney disease: Secondary | ICD-10-CM | POA: Diagnosis not present

## 2018-12-07 DIAGNOSIS — N183 Chronic kidney disease, stage 3 (moderate): Secondary | ICD-10-CM | POA: Diagnosis not present

## 2018-12-07 DIAGNOSIS — E782 Mixed hyperlipidemia: Secondary | ICD-10-CM | POA: Diagnosis not present

## 2018-12-19 ENCOUNTER — Other Ambulatory Visit: Payer: Self-pay

## 2018-12-19 NOTE — Patient Outreach (Signed)
[facility]) Care Management  12/19/2018  [name] 11-Jan-1946 [id number]   Medication Adherence call to Mrs. Cheryl Rodriguez [facility] [employer] spoke with patient she is due on Telmisartan 20 mg and Pravastatin 40 mg patient explain Telmisartan was on back order from the manufacture and patient was taking a different medication but now is she is taking Telmisartan and will pick up from [facility]. Mrs. [name] is showing past due under [place].   [facility] Management Direct Dial [phone]  Fax [phone] [email or web address]

## 2019-01-05 DIAGNOSIS — N183 Chronic kidney disease, stage 3 (moderate): Secondary | ICD-10-CM | POA: Diagnosis not present

## 2019-01-05 DIAGNOSIS — E782 Mixed hyperlipidemia: Secondary | ICD-10-CM | POA: Diagnosis not present

## 2019-01-05 DIAGNOSIS — I129 Hypertensive chronic kidney disease with stage 1 through stage 4 chronic kidney disease, or unspecified chronic kidney disease: Secondary | ICD-10-CM | POA: Diagnosis not present

## 2019-01-05 DIAGNOSIS — R7303 Prediabetes: Secondary | ICD-10-CM | POA: Diagnosis not present

## 2019-02-06 DIAGNOSIS — I129 Hypertensive chronic kidney disease with stage 1 through stage 4 chronic kidney disease, or unspecified chronic kidney disease: Secondary | ICD-10-CM | POA: Diagnosis not present

## 2019-02-06 DIAGNOSIS — E782 Mixed hyperlipidemia: Secondary | ICD-10-CM | POA: Diagnosis not present

## 2019-02-06 DIAGNOSIS — N183 Chronic kidney disease, stage 3 (moderate): Secondary | ICD-10-CM | POA: Diagnosis not present

## 2019-02-06 DIAGNOSIS — R7303 Prediabetes: Secondary | ICD-10-CM | POA: Diagnosis not present

## 2019-02-07 DIAGNOSIS — M19011 Primary osteoarthritis, right shoulder: Secondary | ICD-10-CM | POA: Diagnosis not present

## 2019-03-09 DIAGNOSIS — I129 Hypertensive chronic kidney disease with stage 1 through stage 4 chronic kidney disease, or unspecified chronic kidney disease: Secondary | ICD-10-CM | POA: Diagnosis not present

## 2019-03-09 DIAGNOSIS — E782 Mixed hyperlipidemia: Secondary | ICD-10-CM | POA: Diagnosis not present

## 2019-03-09 DIAGNOSIS — R7303 Prediabetes: Secondary | ICD-10-CM | POA: Diagnosis not present

## 2019-03-09 DIAGNOSIS — N183 Chronic kidney disease, stage 3 (moderate): Secondary | ICD-10-CM | POA: Diagnosis not present

## 2019-03-28 DIAGNOSIS — E782 Mixed hyperlipidemia: Secondary | ICD-10-CM | POA: Diagnosis not present

## 2019-03-28 DIAGNOSIS — I129 Hypertensive chronic kidney disease with stage 1 through stage 4 chronic kidney disease, or unspecified chronic kidney disease: Secondary | ICD-10-CM | POA: Diagnosis not present

## 2019-03-28 DIAGNOSIS — R7303 Prediabetes: Secondary | ICD-10-CM | POA: Diagnosis not present

## 2019-04-02 DIAGNOSIS — N390 Urinary tract infection, site not specified: Secondary | ICD-10-CM | POA: Diagnosis not present

## 2019-04-02 DIAGNOSIS — N183 Chronic kidney disease, stage 3 (moderate): Secondary | ICD-10-CM | POA: Diagnosis not present

## 2019-04-02 DIAGNOSIS — I129 Hypertensive chronic kidney disease with stage 1 through stage 4 chronic kidney disease, or unspecified chronic kidney disease: Secondary | ICD-10-CM | POA: Diagnosis not present

## 2019-04-02 DIAGNOSIS — E782 Mixed hyperlipidemia: Secondary | ICD-10-CM | POA: Diagnosis not present

## 2019-04-02 DIAGNOSIS — R7303 Prediabetes: Secondary | ICD-10-CM | POA: Diagnosis not present

## 2019-04-09 DIAGNOSIS — N183 Chronic kidney disease, stage 3 (moderate): Secondary | ICD-10-CM | POA: Diagnosis not present

## 2019-04-09 DIAGNOSIS — R7303 Prediabetes: Secondary | ICD-10-CM | POA: Diagnosis not present

## 2019-04-09 DIAGNOSIS — E782 Mixed hyperlipidemia: Secondary | ICD-10-CM | POA: Diagnosis not present

## 2019-04-09 DIAGNOSIS — I129 Hypertensive chronic kidney disease with stage 1 through stage 4 chronic kidney disease, or unspecified chronic kidney disease: Secondary | ICD-10-CM | POA: Diagnosis not present

## 2019-04-26 DIAGNOSIS — M62830 Muscle spasm of back: Secondary | ICD-10-CM | POA: Diagnosis not present

## 2019-05-09 DIAGNOSIS — I129 Hypertensive chronic kidney disease with stage 1 through stage 4 chronic kidney disease, or unspecified chronic kidney disease: Secondary | ICD-10-CM | POA: Diagnosis not present

## 2019-05-09 DIAGNOSIS — R7303 Prediabetes: Secondary | ICD-10-CM | POA: Diagnosis not present

## 2019-05-09 DIAGNOSIS — E782 Mixed hyperlipidemia: Secondary | ICD-10-CM | POA: Diagnosis not present

## 2019-05-09 DIAGNOSIS — N183 Chronic kidney disease, stage 3 (moderate): Secondary | ICD-10-CM | POA: Diagnosis not present

## 2019-06-06 DIAGNOSIS — E782 Mixed hyperlipidemia: Secondary | ICD-10-CM | POA: Diagnosis not present

## 2019-06-06 DIAGNOSIS — I129 Hypertensive chronic kidney disease with stage 1 through stage 4 chronic kidney disease, or unspecified chronic kidney disease: Secondary | ICD-10-CM | POA: Diagnosis not present

## 2019-06-06 DIAGNOSIS — N183 Chronic kidney disease, stage 3 unspecified: Secondary | ICD-10-CM | POA: Diagnosis not present

## 2019-06-06 DIAGNOSIS — J449 Chronic obstructive pulmonary disease, unspecified: Secondary | ICD-10-CM | POA: Diagnosis not present

## 2019-06-08 DIAGNOSIS — I129 Hypertensive chronic kidney disease with stage 1 through stage 4 chronic kidney disease, or unspecified chronic kidney disease: Secondary | ICD-10-CM | POA: Diagnosis not present

## 2019-06-08 DIAGNOSIS — R7303 Prediabetes: Secondary | ICD-10-CM | POA: Diagnosis not present

## 2019-06-08 DIAGNOSIS — E782 Mixed hyperlipidemia: Secondary | ICD-10-CM | POA: Diagnosis not present

## 2019-06-08 DIAGNOSIS — N183 Chronic kidney disease, stage 3 unspecified: Secondary | ICD-10-CM | POA: Diagnosis not present

## 2019-06-14 DIAGNOSIS — Z23 Encounter for immunization: Secondary | ICD-10-CM | POA: Diagnosis not present

## 2019-06-21 DIAGNOSIS — N183 Chronic kidney disease, stage 3 unspecified: Secondary | ICD-10-CM | POA: Diagnosis not present

## 2019-06-21 DIAGNOSIS — I129 Hypertensive chronic kidney disease with stage 1 through stage 4 chronic kidney disease, or unspecified chronic kidney disease: Secondary | ICD-10-CM | POA: Diagnosis not present

## 2019-07-09 DIAGNOSIS — R7303 Prediabetes: Secondary | ICD-10-CM | POA: Diagnosis not present

## 2019-07-09 DIAGNOSIS — I129 Hypertensive chronic kidney disease with stage 1 through stage 4 chronic kidney disease, or unspecified chronic kidney disease: Secondary | ICD-10-CM | POA: Diagnosis not present

## 2019-07-12 DIAGNOSIS — R7303 Prediabetes: Secondary | ICD-10-CM | POA: Diagnosis not present

## 2019-07-19 DIAGNOSIS — I129 Hypertensive chronic kidney disease with stage 1 through stage 4 chronic kidney disease, or unspecified chronic kidney disease: Secondary | ICD-10-CM | POA: Diagnosis not present

## 2019-07-19 DIAGNOSIS — E782 Mixed hyperlipidemia: Secondary | ICD-10-CM | POA: Diagnosis not present

## 2019-07-26 DIAGNOSIS — R7303 Prediabetes: Secondary | ICD-10-CM | POA: Diagnosis not present

## 2019-07-26 DIAGNOSIS — I129 Hypertensive chronic kidney disease with stage 1 through stage 4 chronic kidney disease, or unspecified chronic kidney disease: Secondary | ICD-10-CM | POA: Diagnosis not present

## 2019-07-26 DIAGNOSIS — J449 Chronic obstructive pulmonary disease, unspecified: Secondary | ICD-10-CM | POA: Diagnosis not present

## 2019-07-26 DIAGNOSIS — N183 Chronic kidney disease, stage 3 unspecified: Secondary | ICD-10-CM | POA: Diagnosis not present

## 2019-08-08 DIAGNOSIS — J449 Chronic obstructive pulmonary disease, unspecified: Secondary | ICD-10-CM | POA: Diagnosis not present

## 2019-08-08 DIAGNOSIS — N183 Chronic kidney disease, stage 3 unspecified: Secondary | ICD-10-CM | POA: Diagnosis not present

## 2019-08-08 DIAGNOSIS — I129 Hypertensive chronic kidney disease with stage 1 through stage 4 chronic kidney disease, or unspecified chronic kidney disease: Secondary | ICD-10-CM | POA: Diagnosis not present

## 2019-09-07 DIAGNOSIS — J449 Chronic obstructive pulmonary disease, unspecified: Secondary | ICD-10-CM | POA: Diagnosis not present

## 2019-09-07 DIAGNOSIS — I129 Hypertensive chronic kidney disease with stage 1 through stage 4 chronic kidney disease, or unspecified chronic kidney disease: Secondary | ICD-10-CM | POA: Diagnosis not present

## 2019-09-07 DIAGNOSIS — N183 Chronic kidney disease, stage 3 unspecified: Secondary | ICD-10-CM | POA: Diagnosis not present

## 2019-10-07 DIAGNOSIS — J449 Chronic obstructive pulmonary disease, unspecified: Secondary | ICD-10-CM | POA: Diagnosis not present

## 2019-10-07 DIAGNOSIS — I129 Hypertensive chronic kidney disease with stage 1 through stage 4 chronic kidney disease, or unspecified chronic kidney disease: Secondary | ICD-10-CM | POA: Diagnosis not present

## 2019-10-07 DIAGNOSIS — N183 Chronic kidney disease, stage 3 unspecified: Secondary | ICD-10-CM | POA: Diagnosis not present

## 2019-10-08 DIAGNOSIS — M545 Low back pain: Secondary | ICD-10-CM | POA: Diagnosis not present

## 2019-10-08 DIAGNOSIS — Z139 Encounter for screening, unspecified: Secondary | ICD-10-CM | POA: Diagnosis not present

## 2019-10-11 DIAGNOSIS — M25551 Pain in right hip: Secondary | ICD-10-CM | POA: Diagnosis not present

## 2019-10-11 DIAGNOSIS — R531 Weakness: Secondary | ICD-10-CM | POA: Diagnosis not present

## 2019-10-11 DIAGNOSIS — R293 Abnormal posture: Secondary | ICD-10-CM | POA: Diagnosis not present

## 2019-10-11 DIAGNOSIS — M545 Low back pain: Secondary | ICD-10-CM | POA: Diagnosis not present

## 2019-10-11 DIAGNOSIS — R2689 Other abnormalities of gait and mobility: Secondary | ICD-10-CM | POA: Diagnosis not present

## 2019-10-15 DIAGNOSIS — M25551 Pain in right hip: Secondary | ICD-10-CM | POA: Diagnosis not present

## 2019-10-15 DIAGNOSIS — R531 Weakness: Secondary | ICD-10-CM | POA: Diagnosis not present

## 2019-10-15 DIAGNOSIS — R293 Abnormal posture: Secondary | ICD-10-CM | POA: Diagnosis not present

## 2019-10-15 DIAGNOSIS — M545 Low back pain: Secondary | ICD-10-CM | POA: Diagnosis not present

## 2019-10-15 DIAGNOSIS — R2689 Other abnormalities of gait and mobility: Secondary | ICD-10-CM | POA: Diagnosis not present

## 2019-10-18 DIAGNOSIS — R293 Abnormal posture: Secondary | ICD-10-CM | POA: Diagnosis not present

## 2019-10-18 DIAGNOSIS — R2689 Other abnormalities of gait and mobility: Secondary | ICD-10-CM | POA: Diagnosis not present

## 2019-10-18 DIAGNOSIS — M545 Low back pain: Secondary | ICD-10-CM | POA: Diagnosis not present

## 2019-10-18 DIAGNOSIS — M25551 Pain in right hip: Secondary | ICD-10-CM | POA: Diagnosis not present

## 2019-10-18 DIAGNOSIS — R531 Weakness: Secondary | ICD-10-CM | POA: Diagnosis not present

## 2019-10-22 DIAGNOSIS — R2689 Other abnormalities of gait and mobility: Secondary | ICD-10-CM | POA: Diagnosis not present

## 2019-10-22 DIAGNOSIS — R293 Abnormal posture: Secondary | ICD-10-CM | POA: Diagnosis not present

## 2019-10-22 DIAGNOSIS — R531 Weakness: Secondary | ICD-10-CM | POA: Diagnosis not present

## 2019-10-22 DIAGNOSIS — M25551 Pain in right hip: Secondary | ICD-10-CM | POA: Diagnosis not present

## 2019-10-22 DIAGNOSIS — M545 Low back pain: Secondary | ICD-10-CM | POA: Diagnosis not present

## 2019-10-25 DIAGNOSIS — M25551 Pain in right hip: Secondary | ICD-10-CM | POA: Diagnosis not present

## 2019-10-25 DIAGNOSIS — R2689 Other abnormalities of gait and mobility: Secondary | ICD-10-CM | POA: Diagnosis not present

## 2019-10-25 DIAGNOSIS — M545 Low back pain: Secondary | ICD-10-CM | POA: Diagnosis not present

## 2019-10-25 DIAGNOSIS — R531 Weakness: Secondary | ICD-10-CM | POA: Diagnosis not present

## 2019-10-25 DIAGNOSIS — R293 Abnormal posture: Secondary | ICD-10-CM | POA: Diagnosis not present

## 2019-10-30 DIAGNOSIS — M25551 Pain in right hip: Secondary | ICD-10-CM | POA: Diagnosis not present

## 2019-10-30 DIAGNOSIS — M545 Low back pain: Secondary | ICD-10-CM | POA: Diagnosis not present

## 2019-10-30 DIAGNOSIS — R531 Weakness: Secondary | ICD-10-CM | POA: Diagnosis not present

## 2019-10-30 DIAGNOSIS — R293 Abnormal posture: Secondary | ICD-10-CM | POA: Diagnosis not present

## 2019-10-30 DIAGNOSIS — R2689 Other abnormalities of gait and mobility: Secondary | ICD-10-CM | POA: Diagnosis not present

## 2019-11-02 DIAGNOSIS — M545 Low back pain: Secondary | ICD-10-CM | POA: Diagnosis not present

## 2019-11-02 DIAGNOSIS — M25551 Pain in right hip: Secondary | ICD-10-CM | POA: Diagnosis not present

## 2019-11-02 DIAGNOSIS — R531 Weakness: Secondary | ICD-10-CM | POA: Diagnosis not present

## 2019-11-02 DIAGNOSIS — R293 Abnormal posture: Secondary | ICD-10-CM | POA: Diagnosis not present

## 2019-11-02 DIAGNOSIS — R2689 Other abnormalities of gait and mobility: Secondary | ICD-10-CM | POA: Diagnosis not present

## 2019-11-06 DIAGNOSIS — R531 Weakness: Secondary | ICD-10-CM | POA: Diagnosis not present

## 2019-11-06 DIAGNOSIS — M545 Low back pain: Secondary | ICD-10-CM | POA: Diagnosis not present

## 2019-11-06 DIAGNOSIS — R293 Abnormal posture: Secondary | ICD-10-CM | POA: Diagnosis not present

## 2019-11-06 DIAGNOSIS — M25551 Pain in right hip: Secondary | ICD-10-CM | POA: Diagnosis not present

## 2019-11-06 DIAGNOSIS — R2689 Other abnormalities of gait and mobility: Secondary | ICD-10-CM | POA: Diagnosis not present

## 2019-11-07 DIAGNOSIS — I129 Hypertensive chronic kidney disease with stage 1 through stage 4 chronic kidney disease, or unspecified chronic kidney disease: Secondary | ICD-10-CM | POA: Diagnosis not present

## 2019-11-07 DIAGNOSIS — N183 Chronic kidney disease, stage 3 unspecified: Secondary | ICD-10-CM | POA: Diagnosis not present

## 2019-11-08 DIAGNOSIS — R531 Weakness: Secondary | ICD-10-CM | POA: Diagnosis not present

## 2019-11-08 DIAGNOSIS — M545 Low back pain: Secondary | ICD-10-CM | POA: Diagnosis not present

## 2019-11-08 DIAGNOSIS — M25551 Pain in right hip: Secondary | ICD-10-CM | POA: Diagnosis not present

## 2019-11-08 DIAGNOSIS — R2689 Other abnormalities of gait and mobility: Secondary | ICD-10-CM | POA: Diagnosis not present

## 2019-11-08 DIAGNOSIS — R293 Abnormal posture: Secondary | ICD-10-CM | POA: Diagnosis not present

## 2019-11-13 DIAGNOSIS — R2689 Other abnormalities of gait and mobility: Secondary | ICD-10-CM | POA: Diagnosis not present

## 2019-11-13 DIAGNOSIS — R531 Weakness: Secondary | ICD-10-CM | POA: Diagnosis not present

## 2019-11-13 DIAGNOSIS — M545 Low back pain: Secondary | ICD-10-CM | POA: Diagnosis not present

## 2019-11-13 DIAGNOSIS — M25551 Pain in right hip: Secondary | ICD-10-CM | POA: Diagnosis not present

## 2019-11-13 DIAGNOSIS — R293 Abnormal posture: Secondary | ICD-10-CM | POA: Diagnosis not present

## 2019-11-20 DIAGNOSIS — Z7189 Other specified counseling: Secondary | ICD-10-CM | POA: Diagnosis not present

## 2019-11-20 DIAGNOSIS — Z0000 Encounter for general adult medical examination without abnormal findings: Secondary | ICD-10-CM | POA: Diagnosis not present

## 2019-11-20 DIAGNOSIS — E782 Mixed hyperlipidemia: Secondary | ICD-10-CM | POA: Diagnosis not present

## 2019-11-20 DIAGNOSIS — R7303 Prediabetes: Secondary | ICD-10-CM | POA: Diagnosis not present

## 2019-11-20 DIAGNOSIS — Z136 Encounter for screening for cardiovascular disorders: Secondary | ICD-10-CM | POA: Diagnosis not present

## 2019-11-20 DIAGNOSIS — Z139 Encounter for screening, unspecified: Secondary | ICD-10-CM | POA: Diagnosis not present

## 2019-11-25 DIAGNOSIS — R05 Cough: Secondary | ICD-10-CM | POA: Diagnosis not present

## 2019-12-03 DIAGNOSIS — J449 Chronic obstructive pulmonary disease, unspecified: Secondary | ICD-10-CM | POA: Diagnosis not present

## 2019-12-03 DIAGNOSIS — E876 Hypokalemia: Secondary | ICD-10-CM | POA: Diagnosis not present

## 2019-12-03 DIAGNOSIS — I10 Essential (primary) hypertension: Secondary | ICD-10-CM | POA: Diagnosis not present

## 2019-12-03 DIAGNOSIS — R0902 Hypoxemia: Secondary | ICD-10-CM | POA: Diagnosis not present

## 2019-12-03 DIAGNOSIS — Z87891 Personal history of nicotine dependence: Secondary | ICD-10-CM | POA: Diagnosis not present

## 2019-12-03 DIAGNOSIS — R197 Diarrhea, unspecified: Secondary | ICD-10-CM | POA: Diagnosis not present

## 2019-12-03 DIAGNOSIS — K219 Gastro-esophageal reflux disease without esophagitis: Secondary | ICD-10-CM | POA: Diagnosis not present

## 2019-12-03 DIAGNOSIS — M159 Polyosteoarthritis, unspecified: Secondary | ICD-10-CM | POA: Diagnosis not present

## 2019-12-03 DIAGNOSIS — R05 Cough: Secondary | ICD-10-CM | POA: Diagnosis not present

## 2019-12-03 DIAGNOSIS — U071 COVID-19: Secondary | ICD-10-CM | POA: Diagnosis not present

## 2019-12-03 DIAGNOSIS — R0602 Shortness of breath: Secondary | ICD-10-CM | POA: Diagnosis not present

## 2019-12-03 DIAGNOSIS — Z79899 Other long term (current) drug therapy: Secondary | ICD-10-CM | POA: Diagnosis not present

## 2019-12-03 DIAGNOSIS — J440 Chronic obstructive pulmonary disease with acute lower respiratory infection: Secondary | ICD-10-CM | POA: Diagnosis not present

## 2019-12-03 DIAGNOSIS — J9601 Acute respiratory failure with hypoxia: Secondary | ICD-10-CM | POA: Diagnosis not present

## 2019-12-03 DIAGNOSIS — I129 Hypertensive chronic kidney disease with stage 1 through stage 4 chronic kidney disease, or unspecified chronic kidney disease: Secondary | ICD-10-CM | POA: Diagnosis not present

## 2019-12-03 DIAGNOSIS — R112 Nausea with vomiting, unspecified: Secondary | ICD-10-CM | POA: Diagnosis not present

## 2019-12-03 DIAGNOSIS — N183 Chronic kidney disease, stage 3 unspecified: Secondary | ICD-10-CM | POA: Diagnosis not present

## 2019-12-03 DIAGNOSIS — E7800 Pure hypercholesterolemia, unspecified: Secondary | ICD-10-CM | POA: Diagnosis not present

## 2019-12-04 DIAGNOSIS — U071 COVID-19: Secondary | ICD-10-CM | POA: Diagnosis not present

## 2019-12-04 DIAGNOSIS — J449 Chronic obstructive pulmonary disease, unspecified: Secondary | ICD-10-CM | POA: Diagnosis not present

## 2019-12-04 DIAGNOSIS — R112 Nausea with vomiting, unspecified: Secondary | ICD-10-CM | POA: Diagnosis not present

## 2019-12-04 DIAGNOSIS — R0902 Hypoxemia: Secondary | ICD-10-CM | POA: Diagnosis not present

## 2019-12-05 DIAGNOSIS — R0902 Hypoxemia: Secondary | ICD-10-CM | POA: Diagnosis not present

## 2019-12-05 DIAGNOSIS — R112 Nausea with vomiting, unspecified: Secondary | ICD-10-CM | POA: Diagnosis not present

## 2019-12-05 DIAGNOSIS — U071 COVID-19: Secondary | ICD-10-CM | POA: Diagnosis not present

## 2019-12-05 DIAGNOSIS — J449 Chronic obstructive pulmonary disease, unspecified: Secondary | ICD-10-CM | POA: Diagnosis not present

## 2019-12-06 DIAGNOSIS — R0902 Hypoxemia: Secondary | ICD-10-CM | POA: Diagnosis not present

## 2019-12-06 DIAGNOSIS — J449 Chronic obstructive pulmonary disease, unspecified: Secondary | ICD-10-CM | POA: Diagnosis not present

## 2019-12-06 DIAGNOSIS — U071 COVID-19: Secondary | ICD-10-CM | POA: Diagnosis not present

## 2019-12-06 DIAGNOSIS — R112 Nausea with vomiting, unspecified: Secondary | ICD-10-CM | POA: Diagnosis not present

## 2019-12-07 DIAGNOSIS — R112 Nausea with vomiting, unspecified: Secondary | ICD-10-CM | POA: Diagnosis not present

## 2019-12-07 DIAGNOSIS — I129 Hypertensive chronic kidney disease with stage 1 through stage 4 chronic kidney disease, or unspecified chronic kidney disease: Secondary | ICD-10-CM | POA: Diagnosis not present

## 2019-12-07 DIAGNOSIS — N183 Chronic kidney disease, stage 3 unspecified: Secondary | ICD-10-CM | POA: Diagnosis not present

## 2019-12-07 DIAGNOSIS — J449 Chronic obstructive pulmonary disease, unspecified: Secondary | ICD-10-CM | POA: Diagnosis not present

## 2019-12-07 DIAGNOSIS — U071 COVID-19: Secondary | ICD-10-CM | POA: Diagnosis not present

## 2019-12-07 DIAGNOSIS — R0902 Hypoxemia: Secondary | ICD-10-CM | POA: Diagnosis not present

## 2019-12-12 DIAGNOSIS — Z9981 Dependence on supplemental oxygen: Secondary | ICD-10-CM | POA: Diagnosis not present

## 2019-12-12 DIAGNOSIS — J9601 Acute respiratory failure with hypoxia: Secondary | ICD-10-CM | POA: Diagnosis not present

## 2019-12-12 DIAGNOSIS — M160 Bilateral primary osteoarthritis of hip: Secondary | ICD-10-CM | POA: Diagnosis not present

## 2019-12-12 DIAGNOSIS — G4733 Obstructive sleep apnea (adult) (pediatric): Secondary | ICD-10-CM | POA: Diagnosis not present

## 2019-12-12 DIAGNOSIS — Z87891 Personal history of nicotine dependence: Secondary | ICD-10-CM | POA: Diagnosis not present

## 2019-12-12 DIAGNOSIS — J1282 Pneumonia due to coronavirus disease 2019: Secondary | ICD-10-CM | POA: Diagnosis not present

## 2019-12-12 DIAGNOSIS — U071 COVID-19: Secondary | ICD-10-CM | POA: Diagnosis not present

## 2019-12-12 DIAGNOSIS — M19011 Primary osteoarthritis, right shoulder: Secondary | ICD-10-CM | POA: Diagnosis not present

## 2019-12-12 DIAGNOSIS — M170 Bilateral primary osteoarthritis of knee: Secondary | ICD-10-CM | POA: Diagnosis not present

## 2019-12-12 DIAGNOSIS — J440 Chronic obstructive pulmonary disease with acute lower respiratory infection: Secondary | ICD-10-CM | POA: Diagnosis not present

## 2019-12-12 DIAGNOSIS — E785 Hyperlipidemia, unspecified: Secondary | ICD-10-CM | POA: Diagnosis not present

## 2019-12-12 DIAGNOSIS — K219 Gastro-esophageal reflux disease without esophagitis: Secondary | ICD-10-CM | POA: Diagnosis not present

## 2019-12-12 DIAGNOSIS — I10 Essential (primary) hypertension: Secondary | ICD-10-CM | POA: Diagnosis not present

## 2019-12-14 DIAGNOSIS — U071 COVID-19: Secondary | ICD-10-CM | POA: Diagnosis not present

## 2019-12-14 DIAGNOSIS — J440 Chronic obstructive pulmonary disease with acute lower respiratory infection: Secondary | ICD-10-CM | POA: Diagnosis not present

## 2019-12-14 DIAGNOSIS — M19011 Primary osteoarthritis, right shoulder: Secondary | ICD-10-CM | POA: Diagnosis not present

## 2019-12-14 DIAGNOSIS — J9601 Acute respiratory failure with hypoxia: Secondary | ICD-10-CM | POA: Diagnosis not present

## 2019-12-14 DIAGNOSIS — K219 Gastro-esophageal reflux disease without esophagitis: Secondary | ICD-10-CM | POA: Diagnosis not present

## 2019-12-14 DIAGNOSIS — I10 Essential (primary) hypertension: Secondary | ICD-10-CM | POA: Diagnosis not present

## 2019-12-14 DIAGNOSIS — M160 Bilateral primary osteoarthritis of hip: Secondary | ICD-10-CM | POA: Diagnosis not present

## 2019-12-14 DIAGNOSIS — J1282 Pneumonia due to coronavirus disease 2019: Secondary | ICD-10-CM | POA: Diagnosis not present

## 2019-12-14 DIAGNOSIS — E785 Hyperlipidemia, unspecified: Secondary | ICD-10-CM | POA: Diagnosis not present

## 2019-12-14 DIAGNOSIS — Z9981 Dependence on supplemental oxygen: Secondary | ICD-10-CM | POA: Diagnosis not present

## 2019-12-14 DIAGNOSIS — G4733 Obstructive sleep apnea (adult) (pediatric): Secondary | ICD-10-CM | POA: Diagnosis not present

## 2019-12-14 DIAGNOSIS — Z87891 Personal history of nicotine dependence: Secondary | ICD-10-CM | POA: Diagnosis not present

## 2019-12-14 DIAGNOSIS — M170 Bilateral primary osteoarthritis of knee: Secondary | ICD-10-CM | POA: Diagnosis not present

## 2019-12-17 DIAGNOSIS — U071 COVID-19: Secondary | ICD-10-CM | POA: Diagnosis not present

## 2019-12-17 DIAGNOSIS — E538 Deficiency of other specified B group vitamins: Secondary | ICD-10-CM | POA: Diagnosis not present

## 2019-12-17 DIAGNOSIS — Z7689 Persons encountering health services in other specified circumstances: Secondary | ICD-10-CM | POA: Diagnosis not present

## 2019-12-17 DIAGNOSIS — J1282 Pneumonia due to coronavirus disease 2019: Secondary | ICD-10-CM | POA: Diagnosis not present

## 2019-12-17 DIAGNOSIS — D509 Iron deficiency anemia, unspecified: Secondary | ICD-10-CM | POA: Diagnosis not present

## 2019-12-19 DIAGNOSIS — Z87891 Personal history of nicotine dependence: Secondary | ICD-10-CM | POA: Diagnosis not present

## 2019-12-19 DIAGNOSIS — G4733 Obstructive sleep apnea (adult) (pediatric): Secondary | ICD-10-CM | POA: Diagnosis not present

## 2019-12-19 DIAGNOSIS — J1282 Pneumonia due to coronavirus disease 2019: Secondary | ICD-10-CM | POA: Diagnosis not present

## 2019-12-19 DIAGNOSIS — M19011 Primary osteoarthritis, right shoulder: Secondary | ICD-10-CM | POA: Diagnosis not present

## 2019-12-19 DIAGNOSIS — M160 Bilateral primary osteoarthritis of hip: Secondary | ICD-10-CM | POA: Diagnosis not present

## 2019-12-19 DIAGNOSIS — K219 Gastro-esophageal reflux disease without esophagitis: Secondary | ICD-10-CM | POA: Diagnosis not present

## 2019-12-19 DIAGNOSIS — Z9981 Dependence on supplemental oxygen: Secondary | ICD-10-CM | POA: Diagnosis not present

## 2019-12-19 DIAGNOSIS — J9601 Acute respiratory failure with hypoxia: Secondary | ICD-10-CM | POA: Diagnosis not present

## 2019-12-19 DIAGNOSIS — J440 Chronic obstructive pulmonary disease with acute lower respiratory infection: Secondary | ICD-10-CM | POA: Diagnosis not present

## 2019-12-19 DIAGNOSIS — U071 COVID-19: Secondary | ICD-10-CM | POA: Diagnosis not present

## 2019-12-19 DIAGNOSIS — I10 Essential (primary) hypertension: Secondary | ICD-10-CM | POA: Diagnosis not present

## 2019-12-19 DIAGNOSIS — E785 Hyperlipidemia, unspecified: Secondary | ICD-10-CM | POA: Diagnosis not present

## 2019-12-19 DIAGNOSIS — M170 Bilateral primary osteoarthritis of knee: Secondary | ICD-10-CM | POA: Diagnosis not present

## 2019-12-21 DIAGNOSIS — K219 Gastro-esophageal reflux disease without esophagitis: Secondary | ICD-10-CM | POA: Diagnosis not present

## 2019-12-21 DIAGNOSIS — E785 Hyperlipidemia, unspecified: Secondary | ICD-10-CM | POA: Diagnosis not present

## 2019-12-21 DIAGNOSIS — M170 Bilateral primary osteoarthritis of knee: Secondary | ICD-10-CM | POA: Diagnosis not present

## 2019-12-21 DIAGNOSIS — J440 Chronic obstructive pulmonary disease with acute lower respiratory infection: Secondary | ICD-10-CM | POA: Diagnosis not present

## 2019-12-21 DIAGNOSIS — U071 COVID-19: Secondary | ICD-10-CM | POA: Diagnosis not present

## 2019-12-21 DIAGNOSIS — I10 Essential (primary) hypertension: Secondary | ICD-10-CM | POA: Diagnosis not present

## 2019-12-21 DIAGNOSIS — J9601 Acute respiratory failure with hypoxia: Secondary | ICD-10-CM | POA: Diagnosis not present

## 2019-12-21 DIAGNOSIS — G4733 Obstructive sleep apnea (adult) (pediatric): Secondary | ICD-10-CM | POA: Diagnosis not present

## 2019-12-21 DIAGNOSIS — Z87891 Personal history of nicotine dependence: Secondary | ICD-10-CM | POA: Diagnosis not present

## 2019-12-21 DIAGNOSIS — J1282 Pneumonia due to coronavirus disease 2019: Secondary | ICD-10-CM | POA: Diagnosis not present

## 2019-12-21 DIAGNOSIS — Z9981 Dependence on supplemental oxygen: Secondary | ICD-10-CM | POA: Diagnosis not present

## 2019-12-21 DIAGNOSIS — M19011 Primary osteoarthritis, right shoulder: Secondary | ICD-10-CM | POA: Diagnosis not present

## 2019-12-21 DIAGNOSIS — M160 Bilateral primary osteoarthritis of hip: Secondary | ICD-10-CM | POA: Diagnosis not present

## 2019-12-25 DIAGNOSIS — M160 Bilateral primary osteoarthritis of hip: Secondary | ICD-10-CM | POA: Diagnosis not present

## 2019-12-25 DIAGNOSIS — Z9981 Dependence on supplemental oxygen: Secondary | ICD-10-CM | POA: Diagnosis not present

## 2019-12-25 DIAGNOSIS — M19011 Primary osteoarthritis, right shoulder: Secondary | ICD-10-CM | POA: Diagnosis not present

## 2019-12-25 DIAGNOSIS — G4733 Obstructive sleep apnea (adult) (pediatric): Secondary | ICD-10-CM | POA: Diagnosis not present

## 2019-12-25 DIAGNOSIS — J440 Chronic obstructive pulmonary disease with acute lower respiratory infection: Secondary | ICD-10-CM | POA: Diagnosis not present

## 2019-12-25 DIAGNOSIS — Z87891 Personal history of nicotine dependence: Secondary | ICD-10-CM | POA: Diagnosis not present

## 2019-12-25 DIAGNOSIS — I10 Essential (primary) hypertension: Secondary | ICD-10-CM | POA: Diagnosis not present

## 2019-12-25 DIAGNOSIS — M170 Bilateral primary osteoarthritis of knee: Secondary | ICD-10-CM | POA: Diagnosis not present

## 2019-12-25 DIAGNOSIS — J1282 Pneumonia due to coronavirus disease 2019: Secondary | ICD-10-CM | POA: Diagnosis not present

## 2019-12-25 DIAGNOSIS — K219 Gastro-esophageal reflux disease without esophagitis: Secondary | ICD-10-CM | POA: Diagnosis not present

## 2019-12-25 DIAGNOSIS — E785 Hyperlipidemia, unspecified: Secondary | ICD-10-CM | POA: Diagnosis not present

## 2019-12-25 DIAGNOSIS — J9601 Acute respiratory failure with hypoxia: Secondary | ICD-10-CM | POA: Diagnosis not present

## 2019-12-25 DIAGNOSIS — U071 COVID-19: Secondary | ICD-10-CM | POA: Diagnosis not present

## 2019-12-28 DIAGNOSIS — H6980 Other specified disorders of Eustachian tube, unspecified ear: Secondary | ICD-10-CM | POA: Diagnosis not present

## 2019-12-28 DIAGNOSIS — D692 Other nonthrombocytopenic purpura: Secondary | ICD-10-CM | POA: Diagnosis not present

## 2019-12-28 DIAGNOSIS — J3489 Other specified disorders of nose and nasal sinuses: Secondary | ICD-10-CM | POA: Diagnosis not present

## 2019-12-28 DIAGNOSIS — Z139 Encounter for screening, unspecified: Secondary | ICD-10-CM | POA: Diagnosis not present

## 2019-12-28 DIAGNOSIS — E538 Deficiency of other specified B group vitamins: Secondary | ICD-10-CM | POA: Diagnosis not present

## 2020-01-02 DIAGNOSIS — J449 Chronic obstructive pulmonary disease, unspecified: Secondary | ICD-10-CM | POA: Diagnosis not present

## 2020-01-02 DIAGNOSIS — R0902 Hypoxemia: Secondary | ICD-10-CM | POA: Diagnosis not present

## 2020-01-07 DIAGNOSIS — J9610 Chronic respiratory failure, unspecified whether with hypoxia or hypercapnia: Secondary | ICD-10-CM | POA: Diagnosis not present

## 2020-01-07 DIAGNOSIS — J449 Chronic obstructive pulmonary disease, unspecified: Secondary | ICD-10-CM | POA: Diagnosis not present

## 2020-02-06 DIAGNOSIS — N183 Chronic kidney disease, stage 3 unspecified: Secondary | ICD-10-CM | POA: Diagnosis not present

## 2020-02-06 DIAGNOSIS — J449 Chronic obstructive pulmonary disease, unspecified: Secondary | ICD-10-CM | POA: Diagnosis not present

## 2020-02-20 DIAGNOSIS — M25512 Pain in left shoulder: Secondary | ICD-10-CM | POA: Diagnosis not present

## 2020-02-20 DIAGNOSIS — M7542 Impingement syndrome of left shoulder: Secondary | ICD-10-CM | POA: Diagnosis not present

## 2020-02-27 DIAGNOSIS — M25612 Stiffness of left shoulder, not elsewhere classified: Secondary | ICD-10-CM | POA: Diagnosis not present

## 2020-02-27 DIAGNOSIS — M25412 Effusion, left shoulder: Secondary | ICD-10-CM | POA: Diagnosis not present

## 2020-02-27 DIAGNOSIS — M25512 Pain in left shoulder: Secondary | ICD-10-CM | POA: Diagnosis not present

## 2020-03-03 DIAGNOSIS — M25612 Stiffness of left shoulder, not elsewhere classified: Secondary | ICD-10-CM | POA: Diagnosis not present

## 2020-03-03 DIAGNOSIS — M25412 Effusion, left shoulder: Secondary | ICD-10-CM | POA: Diagnosis not present

## 2020-03-03 DIAGNOSIS — M25512 Pain in left shoulder: Secondary | ICD-10-CM | POA: Diagnosis not present

## 2020-03-09 DIAGNOSIS — J449 Chronic obstructive pulmonary disease, unspecified: Secondary | ICD-10-CM | POA: Diagnosis not present

## 2020-03-09 DIAGNOSIS — N183 Chronic kidney disease, stage 3 unspecified: Secondary | ICD-10-CM | POA: Diagnosis not present

## 2020-03-09 DIAGNOSIS — J9610 Chronic respiratory failure, unspecified whether with hypoxia or hypercapnia: Secondary | ICD-10-CM | POA: Diagnosis not present

## 2020-03-10 DIAGNOSIS — M25512 Pain in left shoulder: Secondary | ICD-10-CM | POA: Diagnosis not present

## 2020-03-10 DIAGNOSIS — M25412 Effusion, left shoulder: Secondary | ICD-10-CM | POA: Diagnosis not present

## 2020-03-10 DIAGNOSIS — M25612 Stiffness of left shoulder, not elsewhere classified: Secondary | ICD-10-CM | POA: Diagnosis not present

## 2020-04-08 DIAGNOSIS — J9610 Chronic respiratory failure, unspecified whether with hypoxia or hypercapnia: Secondary | ICD-10-CM | POA: Diagnosis not present

## 2020-04-08 DIAGNOSIS — J449 Chronic obstructive pulmonary disease, unspecified: Secondary | ICD-10-CM | POA: Diagnosis not present

## 2020-04-08 DIAGNOSIS — N183 Chronic kidney disease, stage 3 unspecified: Secondary | ICD-10-CM | POA: Diagnosis not present

## 2020-04-09 DIAGNOSIS — J9610 Chronic respiratory failure, unspecified whether with hypoxia or hypercapnia: Secondary | ICD-10-CM | POA: Diagnosis not present

## 2020-04-09 DIAGNOSIS — N183 Chronic kidney disease, stage 3 unspecified: Secondary | ICD-10-CM | POA: Diagnosis not present

## 2020-04-09 DIAGNOSIS — J449 Chronic obstructive pulmonary disease, unspecified: Secondary | ICD-10-CM | POA: Diagnosis not present

## 2020-04-16 DIAGNOSIS — H52223 Regular astigmatism, bilateral: Secondary | ICD-10-CM | POA: Diagnosis not present

## 2020-04-16 DIAGNOSIS — H35373 Puckering of macula, bilateral: Secondary | ICD-10-CM | POA: Diagnosis not present

## 2020-04-24 DIAGNOSIS — E782 Mixed hyperlipidemia: Secondary | ICD-10-CM | POA: Diagnosis not present

## 2020-04-24 DIAGNOSIS — R7303 Prediabetes: Secondary | ICD-10-CM | POA: Diagnosis not present

## 2020-05-05 DIAGNOSIS — J9610 Chronic respiratory failure, unspecified whether with hypoxia or hypercapnia: Secondary | ICD-10-CM | POA: Diagnosis not present

## 2020-05-05 DIAGNOSIS — D692 Other nonthrombocytopenic purpura: Secondary | ICD-10-CM | POA: Diagnosis not present

## 2020-05-05 DIAGNOSIS — J449 Chronic obstructive pulmonary disease, unspecified: Secondary | ICD-10-CM | POA: Diagnosis not present

## 2020-05-09 DIAGNOSIS — J449 Chronic obstructive pulmonary disease, unspecified: Secondary | ICD-10-CM | POA: Diagnosis not present

## 2020-05-09 DIAGNOSIS — M810 Age-related osteoporosis without current pathological fracture: Secondary | ICD-10-CM | POA: Diagnosis not present

## 2020-05-09 DIAGNOSIS — D692 Other nonthrombocytopenic purpura: Secondary | ICD-10-CM | POA: Diagnosis not present

## 2020-06-09 DIAGNOSIS — J449 Chronic obstructive pulmonary disease, unspecified: Secondary | ICD-10-CM | POA: Diagnosis not present

## 2020-06-09 DIAGNOSIS — M810 Age-related osteoporosis without current pathological fracture: Secondary | ICD-10-CM | POA: Diagnosis not present

## 2020-06-09 DIAGNOSIS — J9610 Chronic respiratory failure, unspecified whether with hypoxia or hypercapnia: Secondary | ICD-10-CM | POA: Diagnosis not present

## 2020-07-08 DIAGNOSIS — J449 Chronic obstructive pulmonary disease, unspecified: Secondary | ICD-10-CM | POA: Diagnosis not present

## 2020-07-08 DIAGNOSIS — U071 COVID-19: Secondary | ICD-10-CM | POA: Diagnosis not present

## 2020-07-09 DIAGNOSIS — J9610 Chronic respiratory failure, unspecified whether with hypoxia or hypercapnia: Secondary | ICD-10-CM | POA: Diagnosis not present

## 2020-07-09 DIAGNOSIS — M810 Age-related osteoporosis without current pathological fracture: Secondary | ICD-10-CM | POA: Diagnosis not present

## 2020-07-09 DIAGNOSIS — J449 Chronic obstructive pulmonary disease, unspecified: Secondary | ICD-10-CM | POA: Diagnosis not present

## 2020-07-15 DIAGNOSIS — H2511 Age-related nuclear cataract, right eye: Secondary | ICD-10-CM | POA: Diagnosis not present

## 2020-07-15 DIAGNOSIS — Z01818 Encounter for other preprocedural examination: Secondary | ICD-10-CM | POA: Diagnosis not present

## 2020-07-22 DIAGNOSIS — H259 Unspecified age-related cataract: Secondary | ICD-10-CM | POA: Diagnosis not present

## 2020-07-22 DIAGNOSIS — H2511 Age-related nuclear cataract, right eye: Secondary | ICD-10-CM | POA: Diagnosis not present

## 2020-07-22 DIAGNOSIS — Z87891 Personal history of nicotine dependence: Secondary | ICD-10-CM | POA: Diagnosis not present

## 2020-07-22 DIAGNOSIS — Z79899 Other long term (current) drug therapy: Secondary | ICD-10-CM | POA: Diagnosis not present

## 2020-07-22 DIAGNOSIS — H25811 Combined forms of age-related cataract, right eye: Secondary | ICD-10-CM | POA: Diagnosis not present

## 2020-07-22 DIAGNOSIS — E785 Hyperlipidemia, unspecified: Secondary | ICD-10-CM | POA: Diagnosis not present

## 2020-07-22 DIAGNOSIS — J449 Chronic obstructive pulmonary disease, unspecified: Secondary | ICD-10-CM | POA: Diagnosis not present

## 2020-08-07 DIAGNOSIS — U071 COVID-19: Secondary | ICD-10-CM | POA: Diagnosis not present

## 2020-08-07 DIAGNOSIS — J449 Chronic obstructive pulmonary disease, unspecified: Secondary | ICD-10-CM | POA: Diagnosis not present

## 2020-08-09 DIAGNOSIS — J9610 Chronic respiratory failure, unspecified whether with hypoxia or hypercapnia: Secondary | ICD-10-CM | POA: Diagnosis not present

## 2020-08-09 DIAGNOSIS — M810 Age-related osteoporosis without current pathological fracture: Secondary | ICD-10-CM | POA: Diagnosis not present

## 2020-08-09 DIAGNOSIS — J449 Chronic obstructive pulmonary disease, unspecified: Secondary | ICD-10-CM | POA: Diagnosis not present

## 2020-08-19 DIAGNOSIS — Z87891 Personal history of nicotine dependence: Secondary | ICD-10-CM | POA: Diagnosis not present

## 2020-08-19 DIAGNOSIS — J449 Chronic obstructive pulmonary disease, unspecified: Secondary | ICD-10-CM | POA: Diagnosis not present

## 2020-08-19 DIAGNOSIS — H259 Unspecified age-related cataract: Secondary | ICD-10-CM | POA: Diagnosis not present

## 2020-08-19 DIAGNOSIS — I10 Essential (primary) hypertension: Secondary | ICD-10-CM | POA: Diagnosis not present

## 2020-08-19 DIAGNOSIS — Z79899 Other long term (current) drug therapy: Secondary | ICD-10-CM | POA: Diagnosis not present

## 2020-08-19 DIAGNOSIS — H25812 Combined forms of age-related cataract, left eye: Secondary | ICD-10-CM | POA: Diagnosis not present

## 2020-08-19 DIAGNOSIS — Z8616 Personal history of COVID-19: Secondary | ICD-10-CM | POA: Diagnosis not present

## 2020-08-19 DIAGNOSIS — E785 Hyperlipidemia, unspecified: Secondary | ICD-10-CM | POA: Diagnosis not present

## 2020-08-19 DIAGNOSIS — G4733 Obstructive sleep apnea (adult) (pediatric): Secondary | ICD-10-CM | POA: Diagnosis not present

## 2020-08-19 DIAGNOSIS — H2512 Age-related nuclear cataract, left eye: Secondary | ICD-10-CM | POA: Diagnosis not present

## 2020-08-19 DIAGNOSIS — K219 Gastro-esophageal reflux disease without esophagitis: Secondary | ICD-10-CM | POA: Diagnosis not present

## 2020-09-01 DIAGNOSIS — E782 Mixed hyperlipidemia: Secondary | ICD-10-CM | POA: Diagnosis not present

## 2020-09-01 DIAGNOSIS — R7303 Prediabetes: Secondary | ICD-10-CM | POA: Diagnosis not present

## 2020-09-07 DIAGNOSIS — J449 Chronic obstructive pulmonary disease, unspecified: Secondary | ICD-10-CM | POA: Diagnosis not present

## 2020-09-07 DIAGNOSIS — U071 COVID-19: Secondary | ICD-10-CM | POA: Diagnosis not present

## 2020-09-09 DIAGNOSIS — J449 Chronic obstructive pulmonary disease, unspecified: Secondary | ICD-10-CM | POA: Diagnosis not present

## 2020-09-09 DIAGNOSIS — J9610 Chronic respiratory failure, unspecified whether with hypoxia or hypercapnia: Secondary | ICD-10-CM | POA: Diagnosis not present

## 2020-09-09 DIAGNOSIS — M810 Age-related osteoporosis without current pathological fracture: Secondary | ICD-10-CM | POA: Diagnosis not present

## 2020-09-11 DIAGNOSIS — R7303 Prediabetes: Secondary | ICD-10-CM | POA: Diagnosis not present

## 2020-09-11 DIAGNOSIS — J9610 Chronic respiratory failure, unspecified whether with hypoxia or hypercapnia: Secondary | ICD-10-CM | POA: Diagnosis not present

## 2020-09-11 DIAGNOSIS — J449 Chronic obstructive pulmonary disease, unspecified: Secondary | ICD-10-CM | POA: Diagnosis not present

## 2020-09-11 DIAGNOSIS — Z136 Encounter for screening for cardiovascular disorders: Secondary | ICD-10-CM | POA: Diagnosis not present

## 2020-09-11 DIAGNOSIS — Z139 Encounter for screening, unspecified: Secondary | ICD-10-CM | POA: Diagnosis not present

## 2020-09-11 DIAGNOSIS — Z0000 Encounter for general adult medical examination without abnormal findings: Secondary | ICD-10-CM | POA: Diagnosis not present

## 2020-10-03 DIAGNOSIS — J9610 Chronic respiratory failure, unspecified whether with hypoxia or hypercapnia: Secondary | ICD-10-CM | POA: Diagnosis not present

## 2020-10-06 DIAGNOSIS — U071 COVID-19: Secondary | ICD-10-CM | POA: Diagnosis not present

## 2020-10-06 DIAGNOSIS — J449 Chronic obstructive pulmonary disease, unspecified: Secondary | ICD-10-CM | POA: Diagnosis not present

## 2020-10-07 DIAGNOSIS — R7303 Prediabetes: Secondary | ICD-10-CM | POA: Diagnosis not present

## 2020-10-07 DIAGNOSIS — J449 Chronic obstructive pulmonary disease, unspecified: Secondary | ICD-10-CM | POA: Diagnosis not present

## 2020-10-07 DIAGNOSIS — J9610 Chronic respiratory failure, unspecified whether with hypoxia or hypercapnia: Secondary | ICD-10-CM | POA: Diagnosis not present

## 2020-10-13 DIAGNOSIS — M7542 Impingement syndrome of left shoulder: Secondary | ICD-10-CM | POA: Diagnosis not present

## 2020-10-20 DIAGNOSIS — M19012 Primary osteoarthritis, left shoulder: Secondary | ICD-10-CM | POA: Diagnosis not present

## 2020-10-20 DIAGNOSIS — M75122 Complete rotator cuff tear or rupture of left shoulder, not specified as traumatic: Secondary | ICD-10-CM | POA: Diagnosis not present

## 2020-10-20 DIAGNOSIS — M25512 Pain in left shoulder: Secondary | ICD-10-CM | POA: Diagnosis not present

## 2020-10-24 DIAGNOSIS — M7542 Impingement syndrome of left shoulder: Secondary | ICD-10-CM | POA: Diagnosis not present

## 2020-10-24 DIAGNOSIS — M19012 Primary osteoarthritis, left shoulder: Secondary | ICD-10-CM | POA: Diagnosis not present

## 2020-11-05 DIAGNOSIS — U071 COVID-19: Secondary | ICD-10-CM | POA: Diagnosis not present

## 2020-11-05 DIAGNOSIS — J449 Chronic obstructive pulmonary disease, unspecified: Secondary | ICD-10-CM | POA: Diagnosis not present

## 2020-11-07 DIAGNOSIS — J9610 Chronic respiratory failure, unspecified whether with hypoxia or hypercapnia: Secondary | ICD-10-CM | POA: Diagnosis not present

## 2020-11-07 DIAGNOSIS — J449 Chronic obstructive pulmonary disease, unspecified: Secondary | ICD-10-CM | POA: Diagnosis not present

## 2020-11-07 DIAGNOSIS — R7303 Prediabetes: Secondary | ICD-10-CM | POA: Diagnosis not present

## 2020-12-03 DIAGNOSIS — M7542 Impingement syndrome of left shoulder: Secondary | ICD-10-CM | POA: Diagnosis not present

## 2020-12-06 DIAGNOSIS — J449 Chronic obstructive pulmonary disease, unspecified: Secondary | ICD-10-CM | POA: Diagnosis not present

## 2020-12-06 DIAGNOSIS — U071 COVID-19: Secondary | ICD-10-CM | POA: Diagnosis not present

## 2020-12-07 DIAGNOSIS — N183 Chronic kidney disease, stage 3 unspecified: Secondary | ICD-10-CM | POA: Diagnosis not present

## 2020-12-07 DIAGNOSIS — E782 Mixed hyperlipidemia: Secondary | ICD-10-CM | POA: Diagnosis not present

## 2020-12-07 DIAGNOSIS — J449 Chronic obstructive pulmonary disease, unspecified: Secondary | ICD-10-CM | POA: Diagnosis not present

## 2020-12-07 DIAGNOSIS — I129 Hypertensive chronic kidney disease with stage 1 through stage 4 chronic kidney disease, or unspecified chronic kidney disease: Secondary | ICD-10-CM | POA: Diagnosis not present

## 2020-12-26 DIAGNOSIS — N183 Chronic kidney disease, stage 3 unspecified: Secondary | ICD-10-CM | POA: Diagnosis not present

## 2020-12-26 DIAGNOSIS — D692 Other nonthrombocytopenic purpura: Secondary | ICD-10-CM | POA: Diagnosis not present

## 2020-12-26 DIAGNOSIS — J449 Chronic obstructive pulmonary disease, unspecified: Secondary | ICD-10-CM | POA: Diagnosis not present

## 2021-01-05 DIAGNOSIS — U071 COVID-19: Secondary | ICD-10-CM | POA: Diagnosis not present

## 2021-01-05 DIAGNOSIS — J449 Chronic obstructive pulmonary disease, unspecified: Secondary | ICD-10-CM | POA: Diagnosis not present

## 2021-01-07 DIAGNOSIS — N183 Chronic kidney disease, stage 3 unspecified: Secondary | ICD-10-CM | POA: Diagnosis not present

## 2021-01-07 DIAGNOSIS — E782 Mixed hyperlipidemia: Secondary | ICD-10-CM | POA: Diagnosis not present

## 2021-01-07 DIAGNOSIS — J449 Chronic obstructive pulmonary disease, unspecified: Secondary | ICD-10-CM | POA: Diagnosis not present

## 2021-01-08 DIAGNOSIS — E782 Mixed hyperlipidemia: Secondary | ICD-10-CM | POA: Diagnosis not present

## 2021-01-08 DIAGNOSIS — R7303 Prediabetes: Secondary | ICD-10-CM | POA: Diagnosis not present

## 2021-01-15 DIAGNOSIS — E782 Mixed hyperlipidemia: Secondary | ICD-10-CM | POA: Diagnosis not present

## 2021-01-15 DIAGNOSIS — N182 Chronic kidney disease, stage 2 (mild): Secondary | ICD-10-CM | POA: Diagnosis not present

## 2021-01-15 DIAGNOSIS — R7303 Prediabetes: Secondary | ICD-10-CM | POA: Diagnosis not present

## 2021-01-15 DIAGNOSIS — I10 Essential (primary) hypertension: Secondary | ICD-10-CM | POA: Diagnosis not present

## 2021-01-16 ENCOUNTER — Other Ambulatory Visit: Payer: Self-pay | Admitting: Family Medicine

## 2021-01-16 DIAGNOSIS — Z1231 Encounter for screening mammogram for malignant neoplasm of breast: Secondary | ICD-10-CM

## 2021-01-28 DIAGNOSIS — G43B1 Ophthalmoplegic migraine, intractable: Secondary | ICD-10-CM | POA: Diagnosis not present

## 2021-02-05 DIAGNOSIS — U071 COVID-19: Secondary | ICD-10-CM | POA: Diagnosis not present

## 2021-02-05 DIAGNOSIS — J449 Chronic obstructive pulmonary disease, unspecified: Secondary | ICD-10-CM | POA: Diagnosis not present

## 2021-02-06 DIAGNOSIS — G43B1 Ophthalmoplegic migraine, intractable: Secondary | ICD-10-CM | POA: Diagnosis not present

## 2021-02-06 DIAGNOSIS — E782 Mixed hyperlipidemia: Secondary | ICD-10-CM | POA: Diagnosis not present

## 2021-02-06 DIAGNOSIS — I10 Essential (primary) hypertension: Secondary | ICD-10-CM | POA: Diagnosis not present

## 2021-02-06 DIAGNOSIS — J449 Chronic obstructive pulmonary disease, unspecified: Secondary | ICD-10-CM | POA: Diagnosis not present

## 2021-03-07 DIAGNOSIS — U071 COVID-19: Secondary | ICD-10-CM | POA: Diagnosis not present

## 2021-03-07 DIAGNOSIS — J449 Chronic obstructive pulmonary disease, unspecified: Secondary | ICD-10-CM | POA: Diagnosis not present

## 2021-03-09 DIAGNOSIS — E782 Mixed hyperlipidemia: Secondary | ICD-10-CM | POA: Diagnosis not present

## 2021-03-09 DIAGNOSIS — J449 Chronic obstructive pulmonary disease, unspecified: Secondary | ICD-10-CM | POA: Diagnosis not present

## 2021-03-18 ENCOUNTER — Ambulatory Visit
Admission: RE | Admit: 2021-03-18 | Discharge: 2021-03-18 | Disposition: A | Discharge status: Home / Self Care | Payer: Medicare Other | Source: Ambulatory Visit | Attending: Family Medicine | Admitting: Family Medicine

## 2021-03-18 DIAGNOSIS — Z1231 Encounter for screening mammogram for malignant neoplasm of breast: Secondary | ICD-10-CM | POA: Diagnosis not present

## 2021-04-07 DIAGNOSIS — J449 Chronic obstructive pulmonary disease, unspecified: Secondary | ICD-10-CM | POA: Diagnosis not present

## 2021-04-07 DIAGNOSIS — U071 COVID-19: Secondary | ICD-10-CM | POA: Diagnosis not present

## 2021-04-09 DIAGNOSIS — I10 Essential (primary) hypertension: Secondary | ICD-10-CM | POA: Diagnosis not present

## 2021-04-09 DIAGNOSIS — E782 Mixed hyperlipidemia: Secondary | ICD-10-CM | POA: Diagnosis not present

## 2021-04-09 DIAGNOSIS — J449 Chronic obstructive pulmonary disease, unspecified: Secondary | ICD-10-CM | POA: Diagnosis not present

## 2021-04-14 DIAGNOSIS — R82998 Other abnormal findings in urine: Secondary | ICD-10-CM | POA: Diagnosis not present

## 2021-04-14 DIAGNOSIS — M549 Dorsalgia, unspecified: Secondary | ICD-10-CM | POA: Diagnosis not present

## 2021-04-15 DIAGNOSIS — R82998 Other abnormal findings in urine: Secondary | ICD-10-CM | POA: Diagnosis not present

## 2021-04-22 DIAGNOSIS — H524 Presbyopia: Secondary | ICD-10-CM | POA: Diagnosis not present

## 2021-04-22 DIAGNOSIS — Z961 Presence of intraocular lens: Secondary | ICD-10-CM | POA: Diagnosis not present

## 2021-04-22 DIAGNOSIS — H43813 Vitreous degeneration, bilateral: Secondary | ICD-10-CM | POA: Diagnosis not present

## 2021-04-22 DIAGNOSIS — Z9849 Cataract extraction status, unspecified eye: Secondary | ICD-10-CM | POA: Diagnosis not present

## 2021-04-22 DIAGNOSIS — H52223 Regular astigmatism, bilateral: Secondary | ICD-10-CM | POA: Diagnosis not present

## 2021-05-04 DIAGNOSIS — Z20822 Contact with and (suspected) exposure to covid-19: Secondary | ICD-10-CM | POA: Diagnosis not present

## 2021-05-04 DIAGNOSIS — J449 Chronic obstructive pulmonary disease, unspecified: Secondary | ICD-10-CM | POA: Diagnosis not present

## 2021-05-04 DIAGNOSIS — R059 Cough, unspecified: Secondary | ICD-10-CM | POA: Diagnosis not present

## 2021-05-04 DIAGNOSIS — J020 Streptococcal pharyngitis: Secondary | ICD-10-CM | POA: Diagnosis not present

## 2021-05-08 DIAGNOSIS — U071 COVID-19: Secondary | ICD-10-CM | POA: Diagnosis not present

## 2021-05-08 DIAGNOSIS — J449 Chronic obstructive pulmonary disease, unspecified: Secondary | ICD-10-CM | POA: Diagnosis not present

## 2021-05-09 DIAGNOSIS — J449 Chronic obstructive pulmonary disease, unspecified: Secondary | ICD-10-CM | POA: Diagnosis not present

## 2021-05-09 DIAGNOSIS — E782 Mixed hyperlipidemia: Secondary | ICD-10-CM | POA: Diagnosis not present

## 2021-05-14 DIAGNOSIS — R7303 Prediabetes: Secondary | ICD-10-CM | POA: Diagnosis not present

## 2021-05-14 DIAGNOSIS — E782 Mixed hyperlipidemia: Secondary | ICD-10-CM | POA: Diagnosis not present

## 2021-05-21 DIAGNOSIS — J020 Streptococcal pharyngitis: Secondary | ICD-10-CM | POA: Diagnosis not present

## 2021-05-21 DIAGNOSIS — R051 Acute cough: Secondary | ICD-10-CM | POA: Diagnosis not present

## 2021-05-21 DIAGNOSIS — Z20822 Contact with and (suspected) exposure to covid-19: Secondary | ICD-10-CM | POA: Diagnosis not present

## 2021-05-25 DIAGNOSIS — F32A Depression, unspecified: Secondary | ICD-10-CM | POA: Diagnosis not present

## 2021-05-25 DIAGNOSIS — Z791 Long term (current) use of non-steroidal anti-inflammatories (NSAID): Secondary | ICD-10-CM | POA: Diagnosis not present

## 2021-05-25 DIAGNOSIS — I441 Atrioventricular block, second degree: Secondary | ICD-10-CM | POA: Diagnosis not present

## 2021-05-25 DIAGNOSIS — Z87891 Personal history of nicotine dependence: Secondary | ICD-10-CM | POA: Diagnosis not present

## 2021-05-25 DIAGNOSIS — I517 Cardiomegaly: Secondary | ICD-10-CM | POA: Diagnosis not present

## 2021-05-25 DIAGNOSIS — Z950 Presence of cardiac pacemaker: Secondary | ICD-10-CM | POA: Diagnosis not present

## 2021-05-25 DIAGNOSIS — Z7983 Long term (current) use of bisphosphonates: Secondary | ICD-10-CM | POA: Diagnosis not present

## 2021-05-25 DIAGNOSIS — Z8616 Personal history of COVID-19: Secondary | ICD-10-CM | POA: Diagnosis not present

## 2021-05-25 DIAGNOSIS — I442 Atrioventricular block, complete: Secondary | ICD-10-CM | POA: Diagnosis not present

## 2021-05-25 DIAGNOSIS — I110 Hypertensive heart disease with heart failure: Secondary | ICD-10-CM | POA: Diagnosis not present

## 2021-05-25 DIAGNOSIS — I440 Atrioventricular block, first degree: Secondary | ICD-10-CM | POA: Diagnosis not present

## 2021-05-25 DIAGNOSIS — R7989 Other specified abnormal findings of blood chemistry: Secondary | ICD-10-CM | POA: Diagnosis not present

## 2021-05-25 DIAGNOSIS — J449 Chronic obstructive pulmonary disease, unspecified: Secondary | ICD-10-CM | POA: Diagnosis not present

## 2021-05-25 DIAGNOSIS — I4430 Unspecified atrioventricular block: Secondary | ICD-10-CM | POA: Diagnosis not present

## 2021-05-25 DIAGNOSIS — R0789 Other chest pain: Secondary | ICD-10-CM | POA: Diagnosis not present

## 2021-05-25 DIAGNOSIS — I492 Junctional premature depolarization: Secondary | ICD-10-CM | POA: Diagnosis not present

## 2021-05-25 DIAGNOSIS — Z45018 Encounter for adjustment and management of other part of cardiac pacemaker: Secondary | ICD-10-CM | POA: Diagnosis not present

## 2021-05-25 DIAGNOSIS — Z8249 Family history of ischemic heart disease and other diseases of the circulatory system: Secondary | ICD-10-CM | POA: Diagnosis not present

## 2021-05-25 DIAGNOSIS — G629 Polyneuropathy, unspecified: Secondary | ICD-10-CM | POA: Diagnosis not present

## 2021-05-25 DIAGNOSIS — R0989 Other specified symptoms and signs involving the circulatory and respiratory systems: Secondary | ICD-10-CM | POA: Diagnosis not present

## 2021-05-25 DIAGNOSIS — I498 Other specified cardiac arrhythmias: Secondary | ICD-10-CM | POA: Diagnosis not present

## 2021-05-25 DIAGNOSIS — E8770 Fluid overload, unspecified: Secondary | ICD-10-CM | POA: Diagnosis not present

## 2021-05-25 DIAGNOSIS — I509 Heart failure, unspecified: Secondary | ICD-10-CM | POA: Diagnosis not present

## 2021-05-25 DIAGNOSIS — J020 Streptococcal pharyngitis: Secondary | ICD-10-CM | POA: Diagnosis not present

## 2021-05-25 DIAGNOSIS — E785 Hyperlipidemia, unspecified: Secondary | ICD-10-CM | POA: Diagnosis not present

## 2021-05-25 DIAGNOSIS — J811 Chronic pulmonary edema: Secondary | ICD-10-CM | POA: Diagnosis not present

## 2021-05-25 DIAGNOSIS — R079 Chest pain, unspecified: Secondary | ICD-10-CM | POA: Diagnosis not present

## 2021-05-25 DIAGNOSIS — I5022 Chronic systolic (congestive) heart failure: Secondary | ICD-10-CM | POA: Diagnosis not present

## 2021-05-25 DIAGNOSIS — I10 Essential (primary) hypertension: Secondary | ICD-10-CM | POA: Diagnosis not present

## 2021-05-25 DIAGNOSIS — R001 Bradycardia, unspecified: Secondary | ICD-10-CM | POA: Diagnosis not present

## 2021-05-25 DIAGNOSIS — I456 Pre-excitation syndrome: Secondary | ICD-10-CM | POA: Diagnosis not present

## 2021-05-25 DIAGNOSIS — I119 Hypertensive heart disease without heart failure: Secondary | ICD-10-CM | POA: Diagnosis not present

## 2021-05-25 DIAGNOSIS — Z951 Presence of aortocoronary bypass graft: Secondary | ICD-10-CM | POA: Diagnosis not present

## 2021-05-25 DIAGNOSIS — R0602 Shortness of breath: Secondary | ICD-10-CM | POA: Diagnosis not present

## 2021-06-07 DIAGNOSIS — J449 Chronic obstructive pulmonary disease, unspecified: Secondary | ICD-10-CM | POA: Diagnosis not present

## 2021-06-07 DIAGNOSIS — U071 COVID-19: Secondary | ICD-10-CM | POA: Diagnosis not present

## 2021-06-09 DIAGNOSIS — J449 Chronic obstructive pulmonary disease, unspecified: Secondary | ICD-10-CM | POA: Diagnosis not present

## 2021-06-09 DIAGNOSIS — E782 Mixed hyperlipidemia: Secondary | ICD-10-CM | POA: Diagnosis not present

## 2021-06-09 DIAGNOSIS — N182 Chronic kidney disease, stage 2 (mild): Secondary | ICD-10-CM | POA: Diagnosis not present

## 2021-06-10 DIAGNOSIS — I442 Atrioventricular block, complete: Secondary | ICD-10-CM | POA: Diagnosis not present

## 2021-06-10 DIAGNOSIS — I493 Ventricular premature depolarization: Secondary | ICD-10-CM | POA: Diagnosis not present

## 2021-06-10 DIAGNOSIS — Z950 Presence of cardiac pacemaker: Secondary | ICD-10-CM | POA: Diagnosis not present

## 2021-06-12 DIAGNOSIS — R002 Palpitations: Secondary | ICD-10-CM | POA: Diagnosis not present

## 2021-06-12 DIAGNOSIS — Z950 Presence of cardiac pacemaker: Secondary | ICD-10-CM | POA: Diagnosis not present

## 2021-06-15 DIAGNOSIS — N183 Chronic kidney disease, stage 3 unspecified: Secondary | ICD-10-CM | POA: Diagnosis not present

## 2021-06-15 DIAGNOSIS — E782 Mixed hyperlipidemia: Secondary | ICD-10-CM | POA: Diagnosis not present

## 2021-06-15 DIAGNOSIS — R7303 Prediabetes: Secondary | ICD-10-CM | POA: Diagnosis not present

## 2021-06-15 DIAGNOSIS — I129 Hypertensive chronic kidney disease with stage 1 through stage 4 chronic kidney disease, or unspecified chronic kidney disease: Secondary | ICD-10-CM | POA: Diagnosis not present

## 2021-06-15 DIAGNOSIS — Z23 Encounter for immunization: Secondary | ICD-10-CM | POA: Diagnosis not present

## 2021-06-24 DIAGNOSIS — J449 Chronic obstructive pulmonary disease, unspecified: Secondary | ICD-10-CM | POA: Diagnosis not present

## 2021-06-30 DIAGNOSIS — R051 Acute cough: Secondary | ICD-10-CM | POA: Diagnosis not present

## 2021-06-30 DIAGNOSIS — R0981 Nasal congestion: Secondary | ICD-10-CM | POA: Diagnosis not present

## 2021-06-30 DIAGNOSIS — J324 Chronic pansinusitis: Secondary | ICD-10-CM | POA: Diagnosis not present

## 2021-06-30 DIAGNOSIS — M791 Myalgia, unspecified site: Secondary | ICD-10-CM | POA: Diagnosis not present

## 2021-07-08 DIAGNOSIS — J449 Chronic obstructive pulmonary disease, unspecified: Secondary | ICD-10-CM | POA: Diagnosis not present

## 2021-07-08 DIAGNOSIS — U071 COVID-19: Secondary | ICD-10-CM | POA: Diagnosis not present

## 2021-07-09 DIAGNOSIS — R7303 Prediabetes: Secondary | ICD-10-CM | POA: Diagnosis not present

## 2021-07-09 DIAGNOSIS — J449 Chronic obstructive pulmonary disease, unspecified: Secondary | ICD-10-CM | POA: Diagnosis not present

## 2021-07-16 DIAGNOSIS — Z45018 Encounter for adjustment and management of other part of cardiac pacemaker: Secondary | ICD-10-CM | POA: Diagnosis not present

## 2021-07-16 DIAGNOSIS — I442 Atrioventricular block, complete: Secondary | ICD-10-CM | POA: Diagnosis not present

## 2021-07-16 DIAGNOSIS — I4430 Unspecified atrioventricular block: Secondary | ICD-10-CM | POA: Diagnosis not present

## 2021-08-07 DIAGNOSIS — J449 Chronic obstructive pulmonary disease, unspecified: Secondary | ICD-10-CM | POA: Diagnosis not present

## 2021-08-07 DIAGNOSIS — U071 COVID-19: Secondary | ICD-10-CM | POA: Diagnosis not present

## 2021-08-09 DIAGNOSIS — J449 Chronic obstructive pulmonary disease, unspecified: Secondary | ICD-10-CM | POA: Diagnosis not present

## 2021-08-09 DIAGNOSIS — E782 Mixed hyperlipidemia: Secondary | ICD-10-CM | POA: Diagnosis not present

## 2021-08-09 DIAGNOSIS — I129 Hypertensive chronic kidney disease with stage 1 through stage 4 chronic kidney disease, or unspecified chronic kidney disease: Secondary | ICD-10-CM | POA: Diagnosis not present

## 2021-08-09 DIAGNOSIS — N183 Chronic kidney disease, stage 3 unspecified: Secondary | ICD-10-CM | POA: Diagnosis not present

## 2021-09-01 DIAGNOSIS — J439 Emphysema, unspecified: Secondary | ICD-10-CM | POA: Diagnosis not present

## 2021-09-01 DIAGNOSIS — I340 Nonrheumatic mitral (valve) insufficiency: Secondary | ICD-10-CM | POA: Diagnosis not present

## 2021-09-01 DIAGNOSIS — E785 Hyperlipidemia, unspecified: Secondary | ICD-10-CM | POA: Diagnosis not present

## 2021-09-01 DIAGNOSIS — I10 Essential (primary) hypertension: Secondary | ICD-10-CM | POA: Diagnosis not present

## 2021-09-01 DIAGNOSIS — I442 Atrioventricular block, complete: Secondary | ICD-10-CM | POA: Diagnosis not present

## 2021-09-01 DIAGNOSIS — Z45018 Encounter for adjustment and management of other part of cardiac pacemaker: Secondary | ICD-10-CM | POA: Diagnosis not present

## 2021-09-07 DIAGNOSIS — J449 Chronic obstructive pulmonary disease, unspecified: Secondary | ICD-10-CM | POA: Diagnosis not present

## 2021-09-07 DIAGNOSIS — U071 COVID-19: Secondary | ICD-10-CM | POA: Diagnosis not present

## 2021-09-09 DIAGNOSIS — N183 Chronic kidney disease, stage 3 unspecified: Secondary | ICD-10-CM | POA: Diagnosis not present

## 2021-09-09 DIAGNOSIS — I129 Hypertensive chronic kidney disease with stage 1 through stage 4 chronic kidney disease, or unspecified chronic kidney disease: Secondary | ICD-10-CM | POA: Diagnosis not present

## 2021-09-09 DIAGNOSIS — E782 Mixed hyperlipidemia: Secondary | ICD-10-CM | POA: Diagnosis not present

## 2021-09-09 DIAGNOSIS — J449 Chronic obstructive pulmonary disease, unspecified: Secondary | ICD-10-CM | POA: Diagnosis not present

## 2021-09-15 DIAGNOSIS — E782 Mixed hyperlipidemia: Secondary | ICD-10-CM | POA: Diagnosis not present

## 2021-09-15 DIAGNOSIS — R7303 Prediabetes: Secondary | ICD-10-CM | POA: Diagnosis not present

## 2021-09-18 DIAGNOSIS — M25511 Pain in right shoulder: Secondary | ICD-10-CM | POA: Diagnosis not present

## 2021-09-24 DIAGNOSIS — Z139 Encounter for screening, unspecified: Secondary | ICD-10-CM | POA: Diagnosis not present

## 2021-09-24 DIAGNOSIS — R7303 Prediabetes: Secondary | ICD-10-CM | POA: Diagnosis not present

## 2021-09-24 DIAGNOSIS — J9610 Chronic respiratory failure, unspecified whether with hypoxia or hypercapnia: Secondary | ICD-10-CM | POA: Diagnosis not present

## 2021-09-24 DIAGNOSIS — Z0000 Encounter for general adult medical examination without abnormal findings: Secondary | ICD-10-CM | POA: Diagnosis not present

## 2021-09-24 DIAGNOSIS — J449 Chronic obstructive pulmonary disease, unspecified: Secondary | ICD-10-CM | POA: Diagnosis not present

## 2021-10-06 DIAGNOSIS — U071 COVID-19: Secondary | ICD-10-CM | POA: Diagnosis not present

## 2021-10-06 DIAGNOSIS — J449 Chronic obstructive pulmonary disease, unspecified: Secondary | ICD-10-CM | POA: Diagnosis not present

## 2021-10-07 DIAGNOSIS — N183 Chronic kidney disease, stage 3 unspecified: Secondary | ICD-10-CM | POA: Diagnosis not present

## 2021-10-07 DIAGNOSIS — I129 Hypertensive chronic kidney disease with stage 1 through stage 4 chronic kidney disease, or unspecified chronic kidney disease: Secondary | ICD-10-CM | POA: Diagnosis not present

## 2021-10-07 DIAGNOSIS — J449 Chronic obstructive pulmonary disease, unspecified: Secondary | ICD-10-CM | POA: Diagnosis not present

## 2021-10-07 DIAGNOSIS — E782 Mixed hyperlipidemia: Secondary | ICD-10-CM | POA: Diagnosis not present

## 2021-11-05 DIAGNOSIS — R059 Cough, unspecified: Secondary | ICD-10-CM | POA: Diagnosis not present

## 2021-11-05 DIAGNOSIS — R0981 Nasal congestion: Secondary | ICD-10-CM | POA: Diagnosis not present

## 2021-11-05 DIAGNOSIS — U071 COVID-19: Secondary | ICD-10-CM | POA: Diagnosis not present

## 2021-11-05 DIAGNOSIS — J449 Chronic obstructive pulmonary disease, unspecified: Secondary | ICD-10-CM | POA: Diagnosis not present

## 2021-11-07 DIAGNOSIS — M810 Age-related osteoporosis without current pathological fracture: Secondary | ICD-10-CM | POA: Diagnosis not present

## 2021-11-07 DIAGNOSIS — I129 Hypertensive chronic kidney disease with stage 1 through stage 4 chronic kidney disease, or unspecified chronic kidney disease: Secondary | ICD-10-CM | POA: Diagnosis not present

## 2021-11-20 DIAGNOSIS — R29810 Facial weakness: Secondary | ICD-10-CM | POA: Diagnosis not present

## 2021-11-20 DIAGNOSIS — I639 Cerebral infarction, unspecified: Secondary | ICD-10-CM | POA: Diagnosis not present

## 2021-11-20 DIAGNOSIS — I440 Atrioventricular block, first degree: Secondary | ICD-10-CM | POA: Diagnosis not present

## 2021-11-20 DIAGNOSIS — J439 Emphysema, unspecified: Secondary | ICD-10-CM | POA: Diagnosis not present

## 2021-11-20 DIAGNOSIS — I248 Other forms of acute ischemic heart disease: Secondary | ICD-10-CM | POA: Diagnosis not present

## 2021-11-20 DIAGNOSIS — I959 Hypotension, unspecified: Secondary | ICD-10-CM | POA: Diagnosis not present

## 2021-11-20 DIAGNOSIS — G629 Polyneuropathy, unspecified: Secondary | ICD-10-CM | POA: Diagnosis not present

## 2021-11-20 DIAGNOSIS — R140 Abdominal distension (gaseous): Secondary | ICD-10-CM | POA: Diagnosis not present

## 2021-11-20 DIAGNOSIS — K219 Gastro-esophageal reflux disease without esophagitis: Secondary | ICD-10-CM | POA: Diagnosis not present

## 2021-11-20 DIAGNOSIS — I6523 Occlusion and stenosis of bilateral carotid arteries: Secondary | ICD-10-CM | POA: Diagnosis not present

## 2021-11-20 DIAGNOSIS — I340 Nonrheumatic mitral (valve) insufficiency: Secondary | ICD-10-CM | POA: Diagnosis not present

## 2021-11-20 DIAGNOSIS — R4701 Aphasia: Secondary | ICD-10-CM | POA: Diagnosis not present

## 2021-11-20 DIAGNOSIS — Z7902 Long term (current) use of antithrombotics/antiplatelets: Secondary | ICD-10-CM | POA: Diagnosis not present

## 2021-11-20 DIAGNOSIS — R29898 Other symptoms and signs involving the musculoskeletal system: Secondary | ICD-10-CM | POA: Diagnosis not present

## 2021-11-20 DIAGNOSIS — E785 Hyperlipidemia, unspecified: Secondary | ICD-10-CM | POA: Diagnosis not present

## 2021-11-20 DIAGNOSIS — I63031 Cerebral infarction due to thrombosis of right carotid artery: Secondary | ICD-10-CM | POA: Diagnosis not present

## 2021-11-20 DIAGNOSIS — R531 Weakness: Secondary | ICD-10-CM | POA: Diagnosis not present

## 2021-11-20 DIAGNOSIS — I472 Ventricular tachycardia, unspecified: Secondary | ICD-10-CM | POA: Diagnosis not present

## 2021-11-20 DIAGNOSIS — I63311 Cerebral infarction due to thrombosis of right middle cerebral artery: Secondary | ICD-10-CM | POA: Diagnosis not present

## 2021-11-20 DIAGNOSIS — R4781 Slurred speech: Secondary | ICD-10-CM | POA: Diagnosis not present

## 2021-11-20 DIAGNOSIS — F32A Depression, unspecified: Secondary | ICD-10-CM | POA: Diagnosis not present

## 2021-11-20 DIAGNOSIS — Z8679 Personal history of other diseases of the circulatory system: Secondary | ICD-10-CM | POA: Diagnosis not present

## 2021-11-20 DIAGNOSIS — Z951 Presence of aortocoronary bypass graft: Secondary | ICD-10-CM | POA: Diagnosis not present

## 2021-11-20 DIAGNOSIS — R6889 Other general symptoms and signs: Secondary | ICD-10-CM | POA: Diagnosis not present

## 2021-11-20 DIAGNOSIS — Z79899 Other long term (current) drug therapy: Secondary | ICD-10-CM | POA: Diagnosis not present

## 2021-11-20 DIAGNOSIS — I10 Essential (primary) hypertension: Secondary | ICD-10-CM | POA: Diagnosis not present

## 2021-11-20 DIAGNOSIS — G988 Other disorders of nervous system: Secondary | ICD-10-CM | POA: Diagnosis not present

## 2021-11-20 DIAGNOSIS — R778 Other specified abnormalities of plasma proteins: Secondary | ICD-10-CM | POA: Diagnosis not present

## 2021-11-20 DIAGNOSIS — I63511 Cerebral infarction due to unspecified occlusion or stenosis of right middle cerebral artery: Secondary | ICD-10-CM | POA: Diagnosis not present

## 2021-11-20 DIAGNOSIS — G460 Middle cerebral artery syndrome: Secondary | ICD-10-CM | POA: Diagnosis not present

## 2021-11-20 DIAGNOSIS — I63411 Cerebral infarction due to embolism of right middle cerebral artery: Secondary | ICD-10-CM | POA: Diagnosis not present

## 2021-11-20 DIAGNOSIS — G8194 Hemiplegia, unspecified affecting left nondominant side: Secondary | ICD-10-CM | POA: Diagnosis not present

## 2021-11-20 DIAGNOSIS — I6522 Occlusion and stenosis of left carotid artery: Secondary | ICD-10-CM | POA: Diagnosis not present

## 2021-11-20 DIAGNOSIS — I6601 Occlusion and stenosis of right middle cerebral artery: Secondary | ICD-10-CM | POA: Diagnosis not present

## 2021-11-20 DIAGNOSIS — Z950 Presence of cardiac pacemaker: Secondary | ICD-10-CM | POA: Diagnosis not present

## 2021-11-20 DIAGNOSIS — R29712 NIHSS score 12: Secondary | ICD-10-CM | POA: Diagnosis not present

## 2021-11-20 DIAGNOSIS — R030 Elevated blood-pressure reading, without diagnosis of hypertension: Secondary | ICD-10-CM | POA: Diagnosis not present

## 2021-11-20 DIAGNOSIS — Z743 Need for continuous supervision: Secondary | ICD-10-CM | POA: Diagnosis not present

## 2021-11-20 DIAGNOSIS — Z9889 Other specified postprocedural states: Secondary | ICD-10-CM | POA: Diagnosis not present

## 2021-11-20 DIAGNOSIS — K652 Spontaneous bacterial peritonitis: Secondary | ICD-10-CM | POA: Diagnosis not present

## 2021-11-20 DIAGNOSIS — R1310 Dysphagia, unspecified: Secondary | ICD-10-CM | POA: Diagnosis not present

## 2021-11-20 DIAGNOSIS — Z7982 Long term (current) use of aspirin: Secondary | ICD-10-CM | POA: Diagnosis not present

## 2021-11-21 DIAGNOSIS — E785 Hyperlipidemia, unspecified: Secondary | ICD-10-CM | POA: Diagnosis not present

## 2021-11-21 DIAGNOSIS — I10 Essential (primary) hypertension: Secondary | ICD-10-CM | POA: Diagnosis not present

## 2021-11-21 DIAGNOSIS — Z8679 Personal history of other diseases of the circulatory system: Secondary | ICD-10-CM | POA: Diagnosis not present

## 2021-11-21 DIAGNOSIS — K219 Gastro-esophageal reflux disease without esophagitis: Secondary | ICD-10-CM | POA: Diagnosis not present

## 2021-11-21 DIAGNOSIS — R531 Weakness: Secondary | ICD-10-CM | POA: Diagnosis not present

## 2021-11-21 DIAGNOSIS — G629 Polyneuropathy, unspecified: Secondary | ICD-10-CM | POA: Diagnosis not present

## 2021-11-21 DIAGNOSIS — I959 Hypotension, unspecified: Secondary | ICD-10-CM | POA: Diagnosis not present

## 2021-11-21 DIAGNOSIS — R778 Other specified abnormalities of plasma proteins: Secondary | ICD-10-CM | POA: Diagnosis not present

## 2021-11-21 DIAGNOSIS — K652 Spontaneous bacterial peritonitis: Secondary | ICD-10-CM | POA: Diagnosis not present

## 2021-11-21 DIAGNOSIS — Z9889 Other specified postprocedural states: Secondary | ICD-10-CM | POA: Diagnosis not present

## 2021-11-21 DIAGNOSIS — I63511 Cerebral infarction due to unspecified occlusion or stenosis of right middle cerebral artery: Secondary | ICD-10-CM | POA: Diagnosis not present

## 2021-11-21 DIAGNOSIS — Z950 Presence of cardiac pacemaker: Secondary | ICD-10-CM | POA: Diagnosis not present

## 2021-11-21 DIAGNOSIS — J439 Emphysema, unspecified: Secondary | ICD-10-CM | POA: Diagnosis not present

## 2021-11-22 DIAGNOSIS — Z951 Presence of aortocoronary bypass graft: Secondary | ICD-10-CM | POA: Diagnosis not present

## 2021-11-22 DIAGNOSIS — I10 Essential (primary) hypertension: Secondary | ICD-10-CM | POA: Diagnosis not present

## 2021-11-22 DIAGNOSIS — Z8679 Personal history of other diseases of the circulatory system: Secondary | ICD-10-CM | POA: Diagnosis not present

## 2021-11-22 DIAGNOSIS — I340 Nonrheumatic mitral (valve) insufficiency: Secondary | ICD-10-CM | POA: Diagnosis not present

## 2021-11-22 DIAGNOSIS — E785 Hyperlipidemia, unspecified: Secondary | ICD-10-CM | POA: Diagnosis not present

## 2021-11-22 DIAGNOSIS — Z9889 Other specified postprocedural states: Secondary | ICD-10-CM | POA: Diagnosis not present

## 2021-11-22 DIAGNOSIS — J439 Emphysema, unspecified: Secondary | ICD-10-CM | POA: Diagnosis not present

## 2021-11-22 DIAGNOSIS — I63511 Cerebral infarction due to unspecified occlusion or stenosis of right middle cerebral artery: Secondary | ICD-10-CM | POA: Diagnosis not present

## 2021-11-23 DIAGNOSIS — R531 Weakness: Secondary | ICD-10-CM | POA: Diagnosis not present

## 2021-11-25 DIAGNOSIS — Z7689 Persons encountering health services in other specified circumstances: Secondary | ICD-10-CM | POA: Diagnosis not present

## 2021-11-25 DIAGNOSIS — R531 Weakness: Secondary | ICD-10-CM | POA: Diagnosis not present

## 2021-11-25 DIAGNOSIS — I672 Cerebral atherosclerosis: Secondary | ICD-10-CM | POA: Diagnosis not present

## 2021-11-25 DIAGNOSIS — Z8673 Personal history of transient ischemic attack (TIA), and cerebral infarction without residual deficits: Secondary | ICD-10-CM | POA: Diagnosis not present

## 2021-12-01 DIAGNOSIS — I700 Atherosclerosis of aorta: Secondary | ICD-10-CM | POA: Diagnosis not present

## 2021-12-01 DIAGNOSIS — M954 Acquired deformity of chest and rib: Secondary | ICD-10-CM | POA: Diagnosis not present

## 2021-12-01 DIAGNOSIS — Z8673 Personal history of transient ischemic attack (TIA), and cerebral infarction without residual deficits: Secondary | ICD-10-CM | POA: Diagnosis not present

## 2021-12-01 DIAGNOSIS — M439 Deforming dorsopathy, unspecified: Secondary | ICD-10-CM | POA: Diagnosis not present

## 2021-12-01 DIAGNOSIS — R0781 Pleurodynia: Secondary | ICD-10-CM | POA: Diagnosis not present

## 2021-12-01 DIAGNOSIS — I517 Cardiomegaly: Secondary | ICD-10-CM | POA: Diagnosis not present

## 2021-12-06 DIAGNOSIS — J449 Chronic obstructive pulmonary disease, unspecified: Secondary | ICD-10-CM | POA: Diagnosis not present

## 2021-12-06 DIAGNOSIS — U071 COVID-19: Secondary | ICD-10-CM | POA: Diagnosis not present

## 2021-12-07 DIAGNOSIS — R6889 Other general symptoms and signs: Secondary | ICD-10-CM | POA: Diagnosis not present

## 2021-12-07 DIAGNOSIS — Z743 Need for continuous supervision: Secondary | ICD-10-CM | POA: Diagnosis not present

## 2021-12-07 DIAGNOSIS — M48061 Spinal stenosis, lumbar region without neurogenic claudication: Secondary | ICD-10-CM | POA: Diagnosis not present

## 2021-12-07 DIAGNOSIS — X58XXXA Exposure to other specified factors, initial encounter: Secondary | ICD-10-CM | POA: Diagnosis not present

## 2021-12-07 DIAGNOSIS — I129 Hypertensive chronic kidney disease with stage 1 through stage 4 chronic kidney disease, or unspecified chronic kidney disease: Secondary | ICD-10-CM | POA: Diagnosis not present

## 2021-12-07 DIAGNOSIS — S32018A Other fracture of first lumbar vertebra, initial encounter for closed fracture: Secondary | ICD-10-CM | POA: Diagnosis not present

## 2021-12-07 DIAGNOSIS — Y999 Unspecified external cause status: Secondary | ICD-10-CM | POA: Diagnosis not present

## 2021-12-07 DIAGNOSIS — I499 Cardiac arrhythmia, unspecified: Secondary | ICD-10-CM | POA: Diagnosis not present

## 2021-12-07 DIAGNOSIS — M545 Low back pain, unspecified: Secondary | ICD-10-CM | POA: Diagnosis not present

## 2021-12-07 DIAGNOSIS — M810 Age-related osteoporosis without current pathological fracture: Secondary | ICD-10-CM | POA: Diagnosis not present

## 2021-12-07 DIAGNOSIS — J80 Acute respiratory distress syndrome: Secondary | ICD-10-CM | POA: Diagnosis not present

## 2021-12-07 DIAGNOSIS — S32029A Unspecified fracture of second lumbar vertebra, initial encounter for closed fracture: Secondary | ICD-10-CM | POA: Diagnosis not present

## 2021-12-07 DIAGNOSIS — R110 Nausea: Secondary | ICD-10-CM | POA: Diagnosis not present

## 2021-12-22 DIAGNOSIS — S32000A Wedge compression fracture of unspecified lumbar vertebra, initial encounter for closed fracture: Secondary | ICD-10-CM | POA: Diagnosis not present

## 2021-12-25 DIAGNOSIS — Z87891 Personal history of nicotine dependence: Secondary | ICD-10-CM | POA: Diagnosis not present

## 2021-12-25 DIAGNOSIS — Z79899 Other long term (current) drug therapy: Secondary | ICD-10-CM | POA: Diagnosis not present

## 2021-12-25 DIAGNOSIS — Z7982 Long term (current) use of aspirin: Secondary | ICD-10-CM | POA: Diagnosis not present

## 2021-12-25 DIAGNOSIS — I6529 Occlusion and stenosis of unspecified carotid artery: Secondary | ICD-10-CM | POA: Diagnosis not present

## 2021-12-25 DIAGNOSIS — E785 Hyperlipidemia, unspecified: Secondary | ICD-10-CM | POA: Diagnosis not present

## 2021-12-25 DIAGNOSIS — Z7902 Long term (current) use of antithrombotics/antiplatelets: Secondary | ICD-10-CM | POA: Diagnosis not present

## 2021-12-25 DIAGNOSIS — E119 Type 2 diabetes mellitus without complications: Secondary | ICD-10-CM | POA: Diagnosis not present

## 2021-12-25 DIAGNOSIS — I63511 Cerebral infarction due to unspecified occlusion or stenosis of right middle cerebral artery: Secondary | ICD-10-CM | POA: Diagnosis not present

## 2021-12-25 DIAGNOSIS — I4891 Unspecified atrial fibrillation: Secondary | ICD-10-CM | POA: Diagnosis not present

## 2021-12-25 DIAGNOSIS — Z7901 Long term (current) use of anticoagulants: Secondary | ICD-10-CM | POA: Diagnosis not present

## 2021-12-25 DIAGNOSIS — I10 Essential (primary) hypertension: Secondary | ICD-10-CM | POA: Diagnosis not present

## 2022-01-05 DIAGNOSIS — J449 Chronic obstructive pulmonary disease, unspecified: Secondary | ICD-10-CM | POA: Diagnosis not present

## 2022-01-05 DIAGNOSIS — U071 COVID-19: Secondary | ICD-10-CM | POA: Diagnosis not present

## 2022-01-07 DIAGNOSIS — I129 Hypertensive chronic kidney disease with stage 1 through stage 4 chronic kidney disease, or unspecified chronic kidney disease: Secondary | ICD-10-CM | POA: Diagnosis not present

## 2022-01-07 DIAGNOSIS — M810 Age-related osteoporosis without current pathological fracture: Secondary | ICD-10-CM | POA: Diagnosis not present

## 2022-01-07 DIAGNOSIS — S32010S Wedge compression fracture of first lumbar vertebra, sequela: Secondary | ICD-10-CM | POA: Diagnosis not present

## 2022-01-07 DIAGNOSIS — R0781 Pleurodynia: Secondary | ICD-10-CM | POA: Diagnosis not present

## 2022-01-14 DIAGNOSIS — Z8673 Personal history of transient ischemic attack (TIA), and cerebral infarction without residual deficits: Secondary | ICD-10-CM | POA: Diagnosis not present

## 2022-01-14 DIAGNOSIS — M159 Polyosteoarthritis, unspecified: Secondary | ICD-10-CM | POA: Diagnosis not present

## 2022-01-14 DIAGNOSIS — R000 Tachycardia, unspecified: Secondary | ICD-10-CM | POA: Diagnosis not present

## 2022-01-14 DIAGNOSIS — I4510 Unspecified right bundle-branch block: Secondary | ICD-10-CM | POA: Diagnosis not present

## 2022-01-14 DIAGNOSIS — K449 Diaphragmatic hernia without obstruction or gangrene: Secondary | ICD-10-CM | POA: Diagnosis not present

## 2022-01-14 DIAGNOSIS — M2578 Osteophyte, vertebrae: Secondary | ICD-10-CM | POA: Diagnosis not present

## 2022-01-14 DIAGNOSIS — M549 Dorsalgia, unspecified: Secondary | ICD-10-CM | POA: Diagnosis not present

## 2022-01-14 DIAGNOSIS — R29890 Loss of height: Secondary | ICD-10-CM | POA: Diagnosis not present

## 2022-01-14 DIAGNOSIS — J9811 Atelectasis: Secondary | ICD-10-CM | POA: Diagnosis not present

## 2022-01-14 DIAGNOSIS — Z043 Encounter for examination and observation following other accident: Secondary | ICD-10-CM | POA: Diagnosis not present

## 2022-01-14 DIAGNOSIS — M419 Scoliosis, unspecified: Secondary | ICD-10-CM | POA: Diagnosis not present

## 2022-01-14 DIAGNOSIS — M545 Low back pain, unspecified: Secondary | ICD-10-CM | POA: Diagnosis not present

## 2022-01-14 DIAGNOSIS — S0990XA Unspecified injury of head, initial encounter: Secondary | ICD-10-CM | POA: Diagnosis not present

## 2022-01-14 DIAGNOSIS — Y998 Other external cause status: Secondary | ICD-10-CM | POA: Diagnosis not present

## 2022-01-14 DIAGNOSIS — M438X6 Other specified deforming dorsopathies, lumbar region: Secondary | ICD-10-CM | POA: Diagnosis not present

## 2022-01-14 DIAGNOSIS — M4802 Spinal stenosis, cervical region: Secondary | ICD-10-CM | POA: Diagnosis not present

## 2022-01-14 DIAGNOSIS — W19XXXA Unspecified fall, initial encounter: Secondary | ICD-10-CM | POA: Diagnosis not present

## 2022-01-14 DIAGNOSIS — R55 Syncope and collapse: Secondary | ICD-10-CM | POA: Diagnosis not present

## 2022-01-14 DIAGNOSIS — I700 Atherosclerosis of aorta: Secondary | ICD-10-CM | POA: Diagnosis not present

## 2022-01-14 DIAGNOSIS — Z7902 Long term (current) use of antithrombotics/antiplatelets: Secondary | ICD-10-CM | POA: Diagnosis not present

## 2022-01-14 DIAGNOSIS — M4186 Other forms of scoliosis, lumbar region: Secondary | ICD-10-CM | POA: Diagnosis not present

## 2022-01-14 DIAGNOSIS — Z87891 Personal history of nicotine dependence: Secondary | ICD-10-CM | POA: Diagnosis not present

## 2022-01-14 DIAGNOSIS — R519 Headache, unspecified: Secondary | ICD-10-CM | POA: Diagnosis not present

## 2022-01-14 DIAGNOSIS — W01198A Fall on same level from slipping, tripping and stumbling with subsequent striking against other object, initial encounter: Secondary | ICD-10-CM | POA: Diagnosis not present

## 2022-01-14 DIAGNOSIS — G4489 Other headache syndrome: Secondary | ICD-10-CM | POA: Diagnosis not present

## 2022-01-14 DIAGNOSIS — Z743 Need for continuous supervision: Secondary | ICD-10-CM | POA: Diagnosis not present

## 2022-01-14 DIAGNOSIS — Z7982 Long term (current) use of aspirin: Secondary | ICD-10-CM | POA: Diagnosis not present

## 2022-01-14 DIAGNOSIS — M4316 Spondylolisthesis, lumbar region: Secondary | ICD-10-CM | POA: Diagnosis not present

## 2022-01-15 DIAGNOSIS — I4510 Unspecified right bundle-branch block: Secondary | ICD-10-CM | POA: Diagnosis not present

## 2022-01-18 DIAGNOSIS — R55 Syncope and collapse: Secondary | ICD-10-CM | POA: Diagnosis not present

## 2022-02-05 DIAGNOSIS — U071 COVID-19: Secondary | ICD-10-CM | POA: Diagnosis not present

## 2022-02-05 DIAGNOSIS — J449 Chronic obstructive pulmonary disease, unspecified: Secondary | ICD-10-CM | POA: Diagnosis not present

## 2022-02-06 DIAGNOSIS — I129 Hypertensive chronic kidney disease with stage 1 through stage 4 chronic kidney disease, or unspecified chronic kidney disease: Secondary | ICD-10-CM | POA: Diagnosis not present

## 2022-02-06 DIAGNOSIS — M810 Age-related osteoporosis without current pathological fracture: Secondary | ICD-10-CM | POA: Diagnosis not present

## 2022-02-12 DIAGNOSIS — Z8673 Personal history of transient ischemic attack (TIA), and cerebral infarction without residual deficits: Secondary | ICD-10-CM | POA: Diagnosis not present

## 2022-02-12 DIAGNOSIS — Z9889 Other specified postprocedural states: Secondary | ICD-10-CM | POA: Diagnosis not present

## 2022-02-12 DIAGNOSIS — I6523 Occlusion and stenosis of bilateral carotid arteries: Secondary | ICD-10-CM | POA: Diagnosis not present

## 2022-02-12 DIAGNOSIS — I63511 Cerebral infarction due to unspecified occlusion or stenosis of right middle cerebral artery: Secondary | ICD-10-CM | POA: Diagnosis not present

## 2022-02-12 DIAGNOSIS — Z7982 Long term (current) use of aspirin: Secondary | ICD-10-CM | POA: Diagnosis not present

## 2022-02-12 DIAGNOSIS — Z955 Presence of coronary angioplasty implant and graft: Secondary | ICD-10-CM | POA: Diagnosis not present

## 2022-02-18 DIAGNOSIS — S32000A Wedge compression fracture of unspecified lumbar vertebra, initial encounter for closed fracture: Secondary | ICD-10-CM | POA: Diagnosis not present

## 2022-02-23 DIAGNOSIS — E782 Mixed hyperlipidemia: Secondary | ICD-10-CM | POA: Diagnosis not present

## 2022-02-23 DIAGNOSIS — R7303 Prediabetes: Secondary | ICD-10-CM | POA: Diagnosis not present

## 2022-03-07 DIAGNOSIS — J449 Chronic obstructive pulmonary disease, unspecified: Secondary | ICD-10-CM | POA: Diagnosis not present

## 2022-03-07 DIAGNOSIS — U071 COVID-19: Secondary | ICD-10-CM | POA: Diagnosis not present

## 2022-03-09 DIAGNOSIS — I10 Essential (primary) hypertension: Secondary | ICD-10-CM | POA: Diagnosis not present

## 2022-03-09 DIAGNOSIS — I129 Hypertensive chronic kidney disease with stage 1 through stage 4 chronic kidney disease, or unspecified chronic kidney disease: Secondary | ICD-10-CM | POA: Diagnosis not present

## 2022-03-09 DIAGNOSIS — E782 Mixed hyperlipidemia: Secondary | ICD-10-CM | POA: Diagnosis not present

## 2022-03-09 DIAGNOSIS — R7303 Prediabetes: Secondary | ICD-10-CM | POA: Diagnosis not present

## 2022-03-09 DIAGNOSIS — Z4509 Encounter for adjustment and management of other cardiac device: Secondary | ICD-10-CM | POA: Diagnosis not present

## 2022-03-10 DIAGNOSIS — I442 Atrioventricular block, complete: Secondary | ICD-10-CM | POA: Diagnosis not present

## 2022-03-10 DIAGNOSIS — E785 Hyperlipidemia, unspecified: Secondary | ICD-10-CM | POA: Diagnosis not present

## 2022-03-10 DIAGNOSIS — I340 Nonrheumatic mitral (valve) insufficiency: Secondary | ICD-10-CM | POA: Diagnosis not present

## 2022-03-10 DIAGNOSIS — J439 Emphysema, unspecified: Secondary | ICD-10-CM | POA: Diagnosis not present

## 2022-03-10 DIAGNOSIS — Z87891 Personal history of nicotine dependence: Secondary | ICD-10-CM | POA: Diagnosis not present

## 2022-03-10 DIAGNOSIS — Z8673 Personal history of transient ischemic attack (TIA), and cerebral infarction without residual deficits: Secondary | ICD-10-CM | POA: Diagnosis not present

## 2022-03-10 DIAGNOSIS — Z45018 Encounter for adjustment and management of other part of cardiac pacemaker: Secondary | ICD-10-CM | POA: Diagnosis not present

## 2022-03-10 DIAGNOSIS — I10 Essential (primary) hypertension: Secondary | ICD-10-CM | POA: Diagnosis not present

## 2022-04-07 DIAGNOSIS — U071 COVID-19: Secondary | ICD-10-CM | POA: Diagnosis not present

## 2022-04-07 DIAGNOSIS — J449 Chronic obstructive pulmonary disease, unspecified: Secondary | ICD-10-CM | POA: Diagnosis not present

## 2022-04-09 DIAGNOSIS — I129 Hypertensive chronic kidney disease with stage 1 through stage 4 chronic kidney disease, or unspecified chronic kidney disease: Secondary | ICD-10-CM | POA: Diagnosis not present

## 2022-04-19 DIAGNOSIS — J309 Allergic rhinitis, unspecified: Secondary | ICD-10-CM | POA: Diagnosis not present

## 2022-04-19 DIAGNOSIS — R051 Acute cough: Secondary | ICD-10-CM | POA: Diagnosis not present

## 2022-05-08 DIAGNOSIS — U071 COVID-19: Secondary | ICD-10-CM | POA: Diagnosis not present

## 2022-05-08 DIAGNOSIS — J449 Chronic obstructive pulmonary disease, unspecified: Secondary | ICD-10-CM | POA: Diagnosis not present

## 2022-05-09 DIAGNOSIS — I129 Hypertensive chronic kidney disease with stage 1 through stage 4 chronic kidney disease, or unspecified chronic kidney disease: Secondary | ICD-10-CM | POA: Diagnosis not present

## 2022-05-10 DIAGNOSIS — S51012A Laceration without foreign body of left elbow, initial encounter: Secondary | ICD-10-CM | POA: Diagnosis not present

## 2022-05-10 DIAGNOSIS — S5002XA Contusion of left elbow, initial encounter: Secondary | ICD-10-CM | POA: Diagnosis not present

## 2022-05-10 DIAGNOSIS — S8002XA Contusion of left knee, initial encounter: Secondary | ICD-10-CM | POA: Diagnosis not present

## 2022-05-24 DIAGNOSIS — S8002XA Contusion of left knee, initial encounter: Secondary | ICD-10-CM | POA: Diagnosis not present

## 2022-05-24 DIAGNOSIS — Z96652 Presence of left artificial knee joint: Secondary | ICD-10-CM | POA: Diagnosis not present

## 2022-06-03 DIAGNOSIS — M545 Low back pain, unspecified: Secondary | ICD-10-CM | POA: Diagnosis not present

## 2022-06-03 DIAGNOSIS — M5136 Other intervertebral disc degeneration, lumbar region: Secondary | ICD-10-CM | POA: Diagnosis not present

## 2022-06-07 DIAGNOSIS — U071 COVID-19: Secondary | ICD-10-CM | POA: Diagnosis not present

## 2022-06-07 DIAGNOSIS — J449 Chronic obstructive pulmonary disease, unspecified: Secondary | ICD-10-CM | POA: Diagnosis not present

## 2022-06-09 DIAGNOSIS — I129 Hypertensive chronic kidney disease with stage 1 through stage 4 chronic kidney disease, or unspecified chronic kidney disease: Secondary | ICD-10-CM | POA: Diagnosis not present

## 2022-07-06 DIAGNOSIS — E782 Mixed hyperlipidemia: Secondary | ICD-10-CM | POA: Diagnosis not present

## 2022-07-06 DIAGNOSIS — I10 Essential (primary) hypertension: Secondary | ICD-10-CM | POA: Diagnosis not present

## 2022-07-06 DIAGNOSIS — R7303 Prediabetes: Secondary | ICD-10-CM | POA: Diagnosis not present

## 2022-07-08 DIAGNOSIS — U071 COVID-19: Secondary | ICD-10-CM | POA: Diagnosis not present

## 2022-07-08 DIAGNOSIS — J449 Chronic obstructive pulmonary disease, unspecified: Secondary | ICD-10-CM | POA: Diagnosis not present

## 2022-07-09 DIAGNOSIS — I129 Hypertensive chronic kidney disease with stage 1 through stage 4 chronic kidney disease, or unspecified chronic kidney disease: Secondary | ICD-10-CM | POA: Diagnosis not present

## 2022-07-12 DIAGNOSIS — J020 Streptococcal pharyngitis: Secondary | ICD-10-CM | POA: Diagnosis not present

## 2022-07-12 DIAGNOSIS — R051 Acute cough: Secondary | ICD-10-CM | POA: Diagnosis not present

## 2022-07-12 DIAGNOSIS — Z139 Encounter for screening, unspecified: Secondary | ICD-10-CM | POA: Diagnosis not present

## 2022-07-12 DIAGNOSIS — Z20822 Contact with and (suspected) exposure to covid-19: Secondary | ICD-10-CM | POA: Diagnosis not present

## 2022-07-20 DIAGNOSIS — I10 Essential (primary) hypertension: Secondary | ICD-10-CM | POA: Diagnosis not present

## 2022-07-20 DIAGNOSIS — R7303 Prediabetes: Secondary | ICD-10-CM | POA: Diagnosis not present

## 2022-07-20 DIAGNOSIS — I129 Hypertensive chronic kidney disease with stage 1 through stage 4 chronic kidney disease, or unspecified chronic kidney disease: Secondary | ICD-10-CM | POA: Diagnosis not present

## 2022-07-20 DIAGNOSIS — Z23 Encounter for immunization: Secondary | ICD-10-CM | POA: Diagnosis not present

## 2022-07-20 DIAGNOSIS — Z9181 History of falling: Secondary | ICD-10-CM | POA: Diagnosis not present

## 2022-07-20 DIAGNOSIS — N183 Chronic kidney disease, stage 3 unspecified: Secondary | ICD-10-CM | POA: Diagnosis not present

## 2022-07-20 DIAGNOSIS — E782 Mixed hyperlipidemia: Secondary | ICD-10-CM | POA: Diagnosis not present

## 2022-07-30 DIAGNOSIS — M12811 Other specific arthropathies, not elsewhere classified, right shoulder: Secondary | ICD-10-CM | POA: Diagnosis not present

## 2022-07-30 DIAGNOSIS — M75101 Unspecified rotator cuff tear or rupture of right shoulder, not specified as traumatic: Secondary | ICD-10-CM | POA: Diagnosis not present

## 2022-08-03 DIAGNOSIS — I129 Hypertensive chronic kidney disease with stage 1 through stage 4 chronic kidney disease, or unspecified chronic kidney disease: Secondary | ICD-10-CM | POA: Diagnosis not present

## 2022-08-03 DIAGNOSIS — N183 Chronic kidney disease, stage 3 unspecified: Secondary | ICD-10-CM | POA: Diagnosis not present

## 2022-08-03 DIAGNOSIS — J069 Acute upper respiratory infection, unspecified: Secondary | ICD-10-CM | POA: Diagnosis not present

## 2022-08-03 DIAGNOSIS — Z20822 Contact with and (suspected) exposure to covid-19: Secondary | ICD-10-CM | POA: Diagnosis not present

## 2022-08-03 DIAGNOSIS — R059 Cough, unspecified: Secondary | ICD-10-CM | POA: Diagnosis not present

## 2022-08-05 DIAGNOSIS — M810 Age-related osteoporosis without current pathological fracture: Secondary | ICD-10-CM | POA: Diagnosis not present

## 2022-08-07 DIAGNOSIS — J449 Chronic obstructive pulmonary disease, unspecified: Secondary | ICD-10-CM | POA: Diagnosis not present

## 2022-08-07 DIAGNOSIS — U071 COVID-19: Secondary | ICD-10-CM | POA: Diagnosis not present

## 2022-08-09 DIAGNOSIS — I129 Hypertensive chronic kidney disease with stage 1 through stage 4 chronic kidney disease, or unspecified chronic kidney disease: Secondary | ICD-10-CM | POA: Diagnosis not present

## 2022-08-09 DIAGNOSIS — J449 Chronic obstructive pulmonary disease, unspecified: Secondary | ICD-10-CM | POA: Diagnosis not present

## 2022-08-17 DIAGNOSIS — Z45018 Encounter for adjustment and management of other part of cardiac pacemaker: Secondary | ICD-10-CM | POA: Diagnosis not present

## 2022-08-17 DIAGNOSIS — Z8709 Personal history of other diseases of the respiratory system: Secondary | ICD-10-CM | POA: Diagnosis not present

## 2022-08-17 DIAGNOSIS — Z8679 Personal history of other diseases of the circulatory system: Secondary | ICD-10-CM | POA: Diagnosis not present

## 2022-08-17 DIAGNOSIS — I442 Atrioventricular block, complete: Secondary | ICD-10-CM | POA: Diagnosis not present

## 2022-08-17 DIAGNOSIS — I4729 Other ventricular tachycardia: Secondary | ICD-10-CM | POA: Diagnosis not present

## 2022-08-26 DIAGNOSIS — R519 Headache, unspecified: Secondary | ICD-10-CM | POA: Diagnosis not present

## 2022-08-26 DIAGNOSIS — Z139 Encounter for screening, unspecified: Secondary | ICD-10-CM | POA: Diagnosis not present

## 2022-08-26 DIAGNOSIS — M810 Age-related osteoporosis without current pathological fracture: Secondary | ICD-10-CM | POA: Diagnosis not present

## 2022-08-26 DIAGNOSIS — I129 Hypertensive chronic kidney disease with stage 1 through stage 4 chronic kidney disease, or unspecified chronic kidney disease: Secondary | ICD-10-CM | POA: Diagnosis not present

## 2022-09-02 DIAGNOSIS — H26493 Other secondary cataract, bilateral: Secondary | ICD-10-CM | POA: Diagnosis not present

## 2022-09-02 DIAGNOSIS — Z961 Presence of intraocular lens: Secondary | ICD-10-CM | POA: Diagnosis not present

## 2022-09-02 DIAGNOSIS — H524 Presbyopia: Secondary | ICD-10-CM | POA: Diagnosis not present

## 2022-09-07 DIAGNOSIS — U071 COVID-19: Secondary | ICD-10-CM | POA: Diagnosis not present

## 2022-09-07 DIAGNOSIS — J449 Chronic obstructive pulmonary disease, unspecified: Secondary | ICD-10-CM | POA: Diagnosis not present

## 2022-09-09 DIAGNOSIS — M810 Age-related osteoporosis without current pathological fracture: Secondary | ICD-10-CM | POA: Diagnosis not present

## 2022-09-09 DIAGNOSIS — I129 Hypertensive chronic kidney disease with stage 1 through stage 4 chronic kidney disease, or unspecified chronic kidney disease: Secondary | ICD-10-CM | POA: Diagnosis not present

## 2022-09-10 DIAGNOSIS — M810 Age-related osteoporosis without current pathological fracture: Secondary | ICD-10-CM | POA: Diagnosis not present

## 2022-10-07 DIAGNOSIS — J449 Chronic obstructive pulmonary disease, unspecified: Secondary | ICD-10-CM | POA: Diagnosis not present

## 2022-10-07 DIAGNOSIS — U071 COVID-19: Secondary | ICD-10-CM | POA: Diagnosis not present

## 2022-10-08 DIAGNOSIS — I129 Hypertensive chronic kidney disease with stage 1 through stage 4 chronic kidney disease, or unspecified chronic kidney disease: Secondary | ICD-10-CM | POA: Diagnosis not present

## 2022-10-08 DIAGNOSIS — M810 Age-related osteoporosis without current pathological fracture: Secondary | ICD-10-CM | POA: Diagnosis not present

## 2022-11-06 DIAGNOSIS — J449 Chronic obstructive pulmonary disease, unspecified: Secondary | ICD-10-CM | POA: Diagnosis not present

## 2022-11-06 DIAGNOSIS — U071 COVID-19: Secondary | ICD-10-CM | POA: Diagnosis not present

## 2022-11-08 DIAGNOSIS — M810 Age-related osteoporosis without current pathological fracture: Secondary | ICD-10-CM | POA: Diagnosis not present

## 2022-11-08 DIAGNOSIS — I129 Hypertensive chronic kidney disease with stage 1 through stage 4 chronic kidney disease, or unspecified chronic kidney disease: Secondary | ICD-10-CM | POA: Diagnosis not present

## 2022-11-24 DIAGNOSIS — I129 Hypertensive chronic kidney disease with stage 1 through stage 4 chronic kidney disease, or unspecified chronic kidney disease: Secondary | ICD-10-CM | POA: Diagnosis not present

## 2022-11-24 DIAGNOSIS — E782 Mixed hyperlipidemia: Secondary | ICD-10-CM | POA: Diagnosis not present

## 2022-11-24 DIAGNOSIS — R7303 Prediabetes: Secondary | ICD-10-CM | POA: Diagnosis not present

## 2022-11-26 DIAGNOSIS — I872 Venous insufficiency (chronic) (peripheral): Secondary | ICD-10-CM | POA: Diagnosis not present

## 2022-11-30 DIAGNOSIS — Z20822 Contact with and (suspected) exposure to covid-19: Secondary | ICD-10-CM | POA: Diagnosis not present

## 2022-11-30 DIAGNOSIS — I10 Essential (primary) hypertension: Secondary | ICD-10-CM | POA: Diagnosis not present

## 2022-11-30 DIAGNOSIS — M7989 Other specified soft tissue disorders: Secondary | ICD-10-CM | POA: Diagnosis not present

## 2022-11-30 DIAGNOSIS — R600 Localized edema: Secondary | ICD-10-CM | POA: Diagnosis not present

## 2022-11-30 DIAGNOSIS — R051 Acute cough: Secondary | ICD-10-CM | POA: Diagnosis not present

## 2022-12-07 DIAGNOSIS — J449 Chronic obstructive pulmonary disease, unspecified: Secondary | ICD-10-CM | POA: Diagnosis not present

## 2022-12-07 DIAGNOSIS — U071 COVID-19: Secondary | ICD-10-CM | POA: Diagnosis not present

## 2022-12-08 DIAGNOSIS — Z45010 Encounter for checking and testing of cardiac pacemaker pulse generator [battery]: Secondary | ICD-10-CM | POA: Diagnosis not present

## 2022-12-08 DIAGNOSIS — I442 Atrioventricular block, complete: Secondary | ICD-10-CM | POA: Diagnosis not present

## 2022-12-08 DIAGNOSIS — I129 Hypertensive chronic kidney disease with stage 1 through stage 4 chronic kidney disease, or unspecified chronic kidney disease: Secondary | ICD-10-CM | POA: Diagnosis not present

## 2022-12-13 DIAGNOSIS — M5136 Other intervertebral disc degeneration, lumbar region: Secondary | ICD-10-CM | POA: Diagnosis not present

## 2022-12-13 DIAGNOSIS — M545 Low back pain, unspecified: Secondary | ICD-10-CM | POA: Diagnosis not present

## 2022-12-13 DIAGNOSIS — S22080A Wedge compression fracture of T11-T12 vertebra, initial encounter for closed fracture: Secondary | ICD-10-CM | POA: Diagnosis not present

## 2022-12-15 IMAGING — MG MM DIGITAL SCREENING BILAT W/ TOMO AND CAD
6 of 12 series · 6 of 36 positions shown · non-contrast
Comparison: Previous exam(s).

CLINICAL DATA: Screening.

EXAM:
DIGITAL SCREENING BILATERAL MAMMOGRAM WITH TOMOSYNTHESIS AND CAD
TECHNIQUE: Bilateral screening digital craniocaudal and mediolateral oblique
mammograms were obtained. Bilateral screening digital breast
tomosynthesis was performed. The images were evaluated with
computer-aided detection.

[L MLO synth-2D]
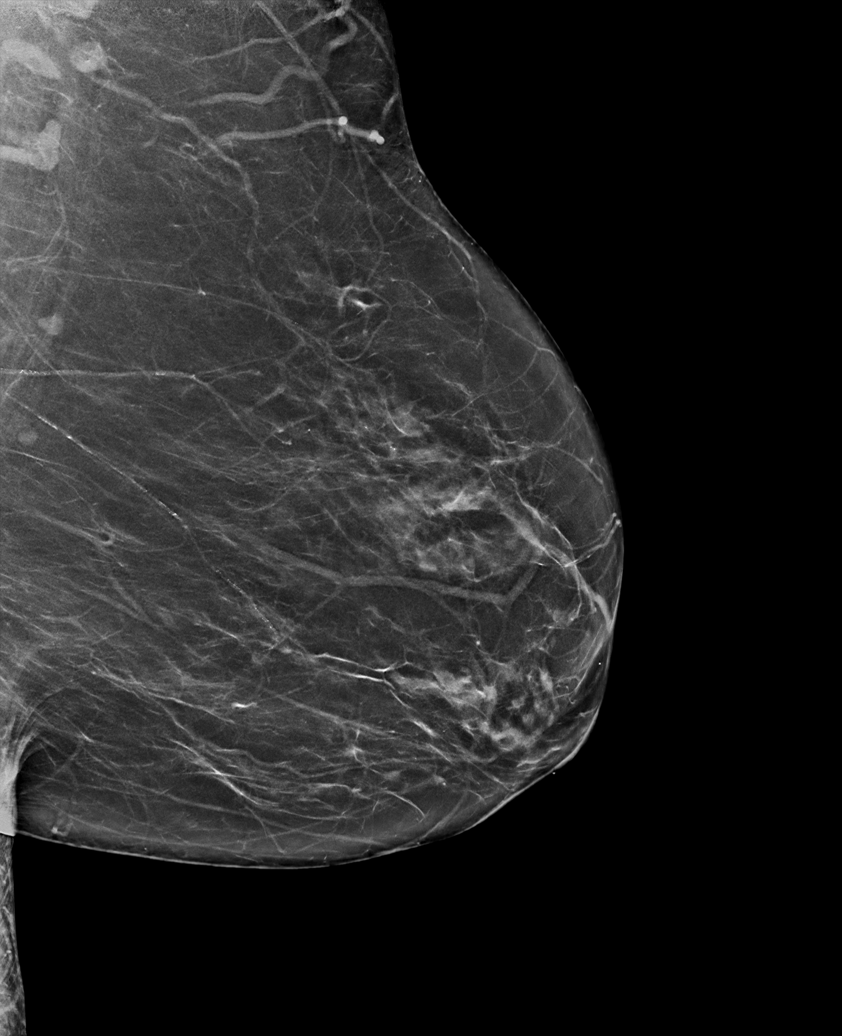

[L CC synth-2D (1 of 2)]
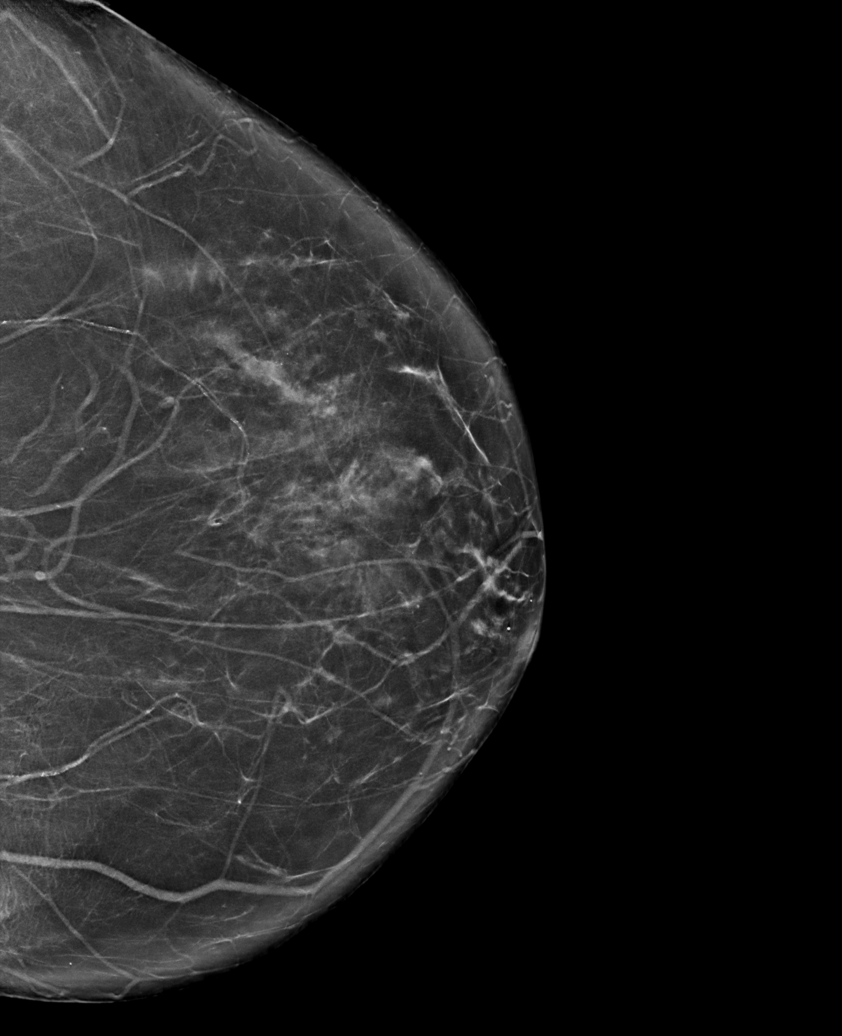

[L CC synth-2D (2 of 2)]
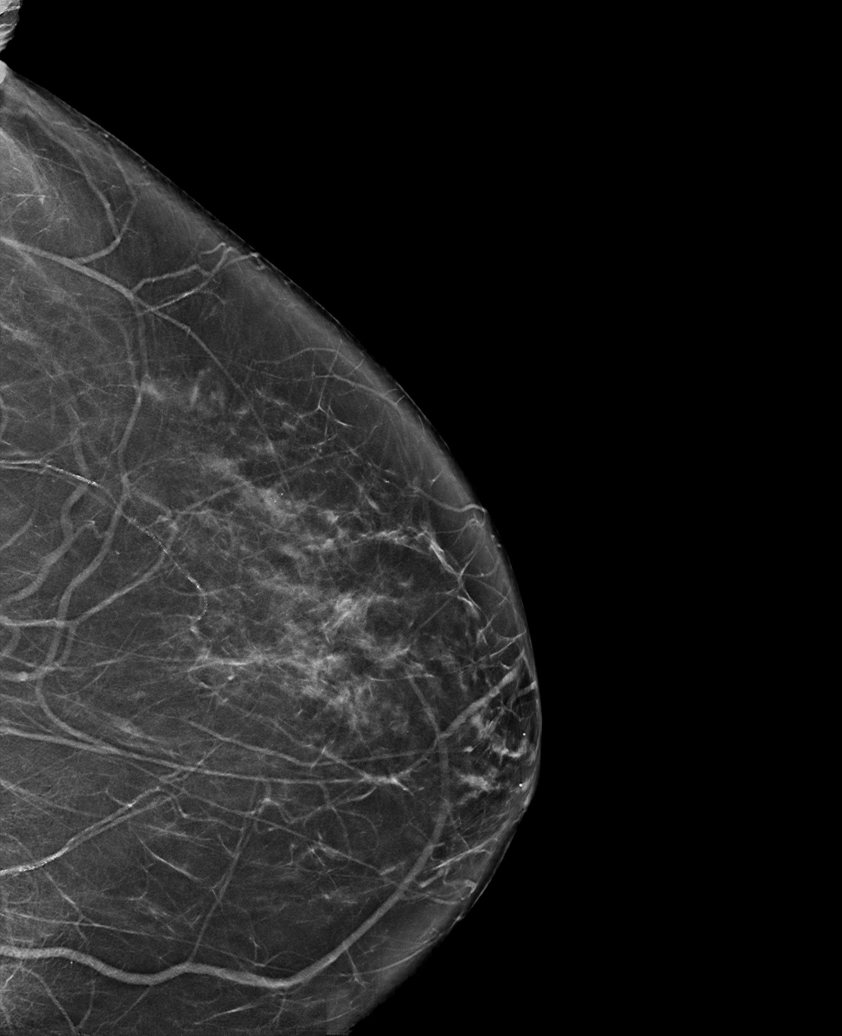

[R MLO synth-2D]
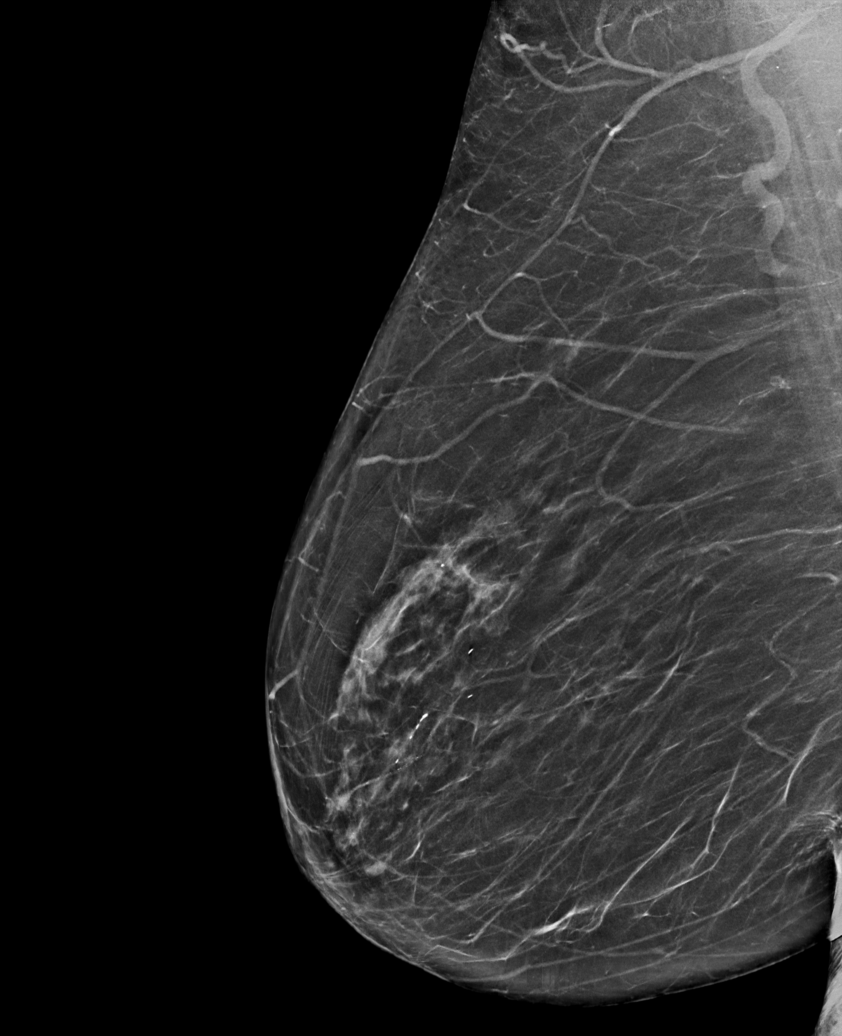

[R CC synth-2D]
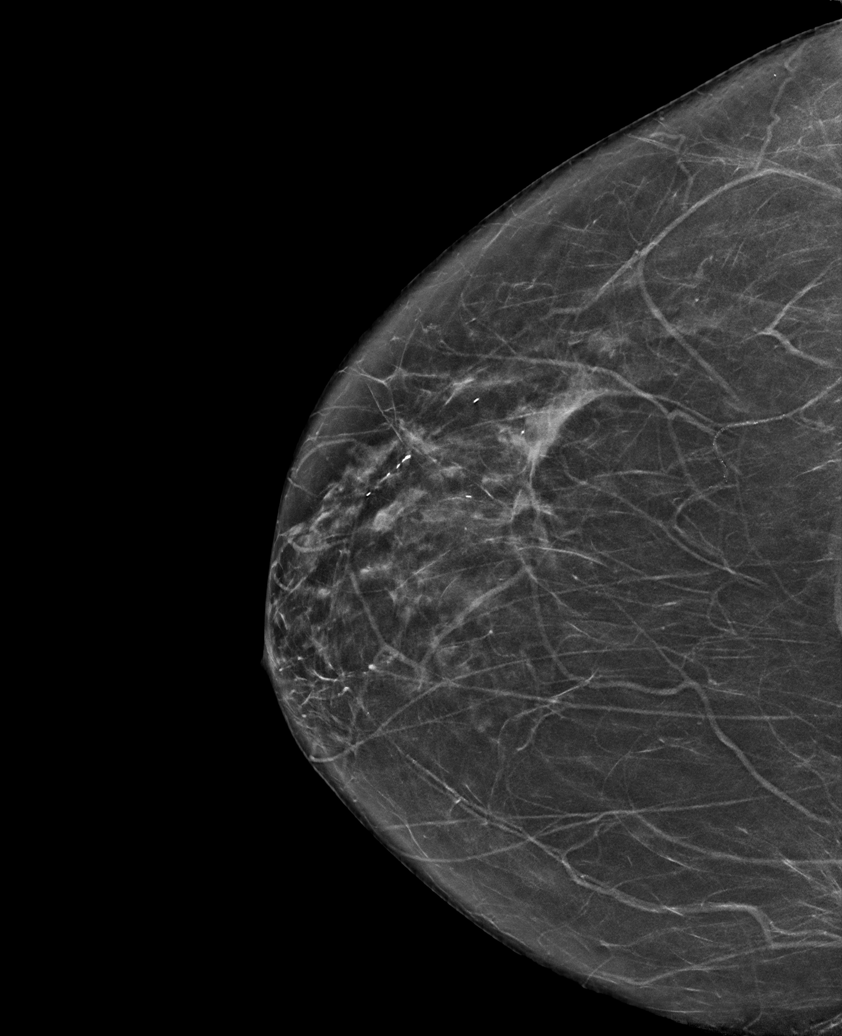

[R CV synth-2D]
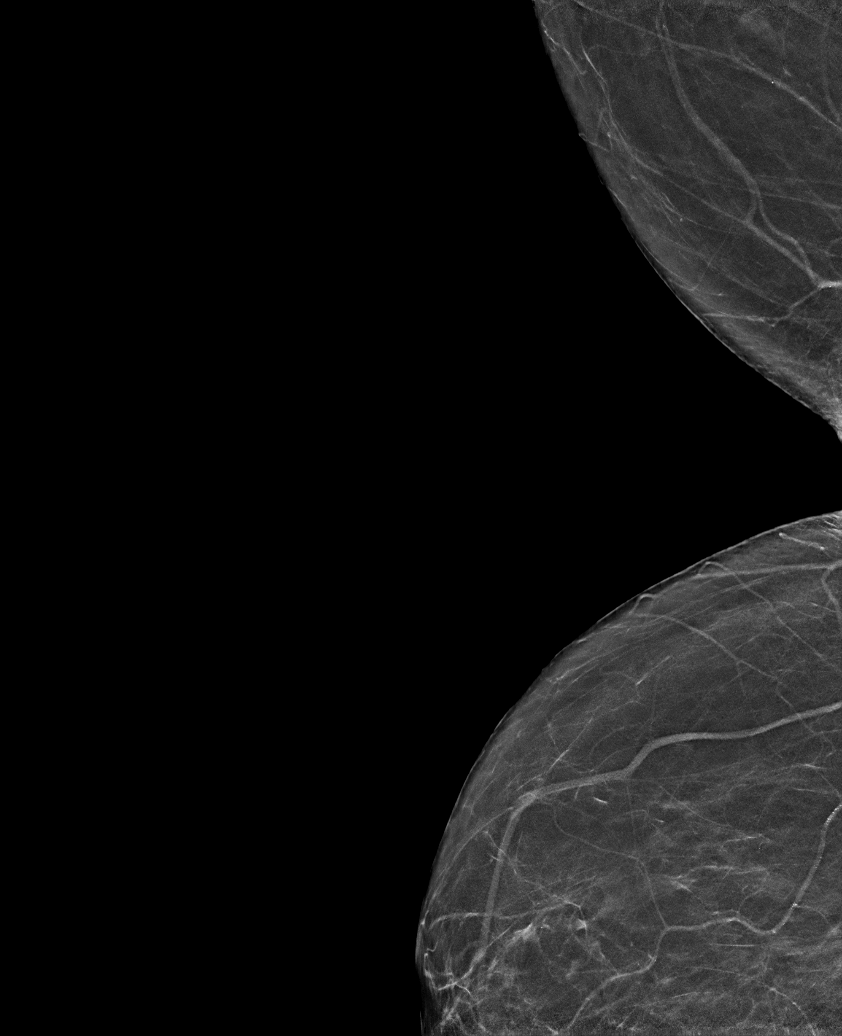

[6 of 36 positions shown; findings below may reference images not displayed]

ACR Breast Density Category b: There are scattered areas of
fibroglandular density.
FINDINGS: There are no findings suspicious for malignancy.
IMPRESSION: No mammographic evidence of malignancy. A result letter of this
screening mammogram will be mailed directly to the patient.

RECOMMENDATION:
Screening mammogram in one year. (Code:51-O-LD2)

BI-RADS CATEGORY  1: Negative.

## 2022-12-20 DIAGNOSIS — M47816 Spondylosis without myelopathy or radiculopathy, lumbar region: Secondary | ICD-10-CM | POA: Diagnosis not present

## 2022-12-20 DIAGNOSIS — M48061 Spinal stenosis, lumbar region without neurogenic claudication: Secondary | ICD-10-CM | POA: Diagnosis not present

## 2022-12-20 DIAGNOSIS — S22080A Wedge compression fracture of T11-T12 vertebra, initial encounter for closed fracture: Secondary | ICD-10-CM | POA: Diagnosis not present

## 2022-12-20 DIAGNOSIS — M549 Dorsalgia, unspecified: Secondary | ICD-10-CM | POA: Diagnosis not present

## 2022-12-27 DIAGNOSIS — Z0000 Encounter for general adult medical examination without abnormal findings: Secondary | ICD-10-CM | POA: Diagnosis not present

## 2022-12-27 DIAGNOSIS — E782 Mixed hyperlipidemia: Secondary | ICD-10-CM | POA: Diagnosis not present

## 2022-12-27 DIAGNOSIS — Z139 Encounter for screening, unspecified: Secondary | ICD-10-CM | POA: Diagnosis not present

## 2022-12-27 DIAGNOSIS — R7303 Prediabetes: Secondary | ICD-10-CM | POA: Diagnosis not present

## 2022-12-27 DIAGNOSIS — N183 Chronic kidney disease, stage 3 unspecified: Secondary | ICD-10-CM | POA: Diagnosis not present

## 2022-12-27 DIAGNOSIS — Z136 Encounter for screening for cardiovascular disorders: Secondary | ICD-10-CM | POA: Diagnosis not present

## 2022-12-27 DIAGNOSIS — Z1389 Encounter for screening for other disorder: Secondary | ICD-10-CM | POA: Diagnosis not present

## 2022-12-27 DIAGNOSIS — I129 Hypertensive chronic kidney disease with stage 1 through stage 4 chronic kidney disease, or unspecified chronic kidney disease: Secondary | ICD-10-CM | POA: Diagnosis not present

## 2023-01-06 DIAGNOSIS — U071 COVID-19: Secondary | ICD-10-CM | POA: Diagnosis not present

## 2023-01-06 DIAGNOSIS — J449 Chronic obstructive pulmonary disease, unspecified: Secondary | ICD-10-CM | POA: Diagnosis not present

## 2023-01-08 DIAGNOSIS — I129 Hypertensive chronic kidney disease with stage 1 through stage 4 chronic kidney disease, or unspecified chronic kidney disease: Secondary | ICD-10-CM | POA: Diagnosis not present

## 2023-01-24 DIAGNOSIS — G8929 Other chronic pain: Secondary | ICD-10-CM | POA: Diagnosis not present

## 2023-01-24 DIAGNOSIS — M25511 Pain in right shoulder: Secondary | ICD-10-CM | POA: Diagnosis not present

## 2023-02-06 DIAGNOSIS — J449 Chronic obstructive pulmonary disease, unspecified: Secondary | ICD-10-CM | POA: Diagnosis not present

## 2023-02-06 DIAGNOSIS — U071 COVID-19: Secondary | ICD-10-CM | POA: Diagnosis not present

## 2023-02-07 DIAGNOSIS — I129 Hypertensive chronic kidney disease with stage 1 through stage 4 chronic kidney disease, or unspecified chronic kidney disease: Secondary | ICD-10-CM | POA: Diagnosis not present

## 2023-02-23 DIAGNOSIS — G894 Chronic pain syndrome: Secondary | ICD-10-CM | POA: Diagnosis not present

## 2023-02-23 DIAGNOSIS — M479 Spondylosis, unspecified: Secondary | ICD-10-CM | POA: Diagnosis not present

## 2023-02-23 DIAGNOSIS — Z1389 Encounter for screening for other disorder: Secondary | ICD-10-CM | POA: Diagnosis not present

## 2023-02-23 DIAGNOSIS — M549 Dorsalgia, unspecified: Secondary | ICD-10-CM | POA: Diagnosis not present

## 2023-02-23 DIAGNOSIS — Z79899 Other long term (current) drug therapy: Secondary | ICD-10-CM | POA: Diagnosis not present

## 2023-02-26 DIAGNOSIS — R0981 Nasal congestion: Secondary | ICD-10-CM | POA: Diagnosis not present

## 2023-02-26 DIAGNOSIS — H9201 Otalgia, right ear: Secondary | ICD-10-CM | POA: Diagnosis not present

## 2023-02-26 DIAGNOSIS — R051 Acute cough: Secondary | ICD-10-CM | POA: Diagnosis not present

## 2023-02-26 DIAGNOSIS — H6691 Otitis media, unspecified, right ear: Secondary | ICD-10-CM | POA: Diagnosis not present

## 2023-03-08 DIAGNOSIS — J449 Chronic obstructive pulmonary disease, unspecified: Secondary | ICD-10-CM | POA: Diagnosis not present

## 2023-03-08 DIAGNOSIS — U071 COVID-19: Secondary | ICD-10-CM | POA: Diagnosis not present

## 2023-03-10 DIAGNOSIS — I129 Hypertensive chronic kidney disease with stage 1 through stage 4 chronic kidney disease, or unspecified chronic kidney disease: Secondary | ICD-10-CM | POA: Diagnosis not present

## 2023-03-16 DIAGNOSIS — M810 Age-related osteoporosis without current pathological fracture: Secondary | ICD-10-CM | POA: Diagnosis not present

## 2023-03-28 DIAGNOSIS — I10 Essential (primary) hypertension: Secondary | ICD-10-CM | POA: Diagnosis not present

## 2023-03-28 DIAGNOSIS — Z950 Presence of cardiac pacemaker: Secondary | ICD-10-CM | POA: Diagnosis not present

## 2023-03-28 DIAGNOSIS — E785 Hyperlipidemia, unspecified: Secondary | ICD-10-CM | POA: Diagnosis not present

## 2023-03-28 DIAGNOSIS — I340 Nonrheumatic mitral (valve) insufficiency: Secondary | ICD-10-CM | POA: Diagnosis not present

## 2023-03-28 DIAGNOSIS — I442 Atrioventricular block, complete: Secondary | ICD-10-CM | POA: Diagnosis not present

## 2023-03-28 DIAGNOSIS — J432 Centrilobular emphysema: Secondary | ICD-10-CM | POA: Diagnosis not present

## 2023-03-30 DIAGNOSIS — M479 Spondylosis, unspecified: Secondary | ICD-10-CM | POA: Diagnosis not present

## 2023-03-30 DIAGNOSIS — Z79899 Other long term (current) drug therapy: Secondary | ICD-10-CM | POA: Diagnosis not present

## 2023-03-30 DIAGNOSIS — Z1389 Encounter for screening for other disorder: Secondary | ICD-10-CM | POA: Diagnosis not present

## 2023-03-30 DIAGNOSIS — G894 Chronic pain syndrome: Secondary | ICD-10-CM | POA: Diagnosis not present

## 2023-03-30 DIAGNOSIS — M549 Dorsalgia, unspecified: Secondary | ICD-10-CM | POA: Diagnosis not present

## 2023-04-08 DIAGNOSIS — Z9862 Peripheral vascular angioplasty status: Secondary | ICD-10-CM | POA: Diagnosis not present

## 2023-04-08 DIAGNOSIS — I6523 Occlusion and stenosis of bilateral carotid arteries: Secondary | ICD-10-CM | POA: Diagnosis not present

## 2023-04-08 DIAGNOSIS — I6521 Occlusion and stenosis of right carotid artery: Secondary | ICD-10-CM | POA: Diagnosis not present

## 2023-04-08 DIAGNOSIS — Z79899 Other long term (current) drug therapy: Secondary | ICD-10-CM | POA: Diagnosis not present

## 2023-04-08 DIAGNOSIS — Z8673 Personal history of transient ischemic attack (TIA), and cerebral infarction without residual deficits: Secondary | ICD-10-CM | POA: Diagnosis not present

## 2023-04-08 DIAGNOSIS — Z7982 Long term (current) use of aspirin: Secondary | ICD-10-CM | POA: Diagnosis not present

## 2023-04-08 DIAGNOSIS — I6601 Occlusion and stenosis of right middle cerebral artery: Secondary | ICD-10-CM | POA: Diagnosis not present

## 2023-04-08 DIAGNOSIS — Z7902 Long term (current) use of antithrombotics/antiplatelets: Secondary | ICD-10-CM | POA: Diagnosis not present

## 2023-04-08 DIAGNOSIS — I63511 Cerebral infarction due to unspecified occlusion or stenosis of right middle cerebral artery: Secondary | ICD-10-CM | POA: Diagnosis not present

## 2023-04-08 DIAGNOSIS — U071 COVID-19: Secondary | ICD-10-CM | POA: Diagnosis not present

## 2023-04-08 DIAGNOSIS — J449 Chronic obstructive pulmonary disease, unspecified: Secondary | ICD-10-CM | POA: Diagnosis not present

## 2023-04-10 DIAGNOSIS — I129 Hypertensive chronic kidney disease with stage 1 through stage 4 chronic kidney disease, or unspecified chronic kidney disease: Secondary | ICD-10-CM | POA: Diagnosis not present

## 2023-04-13 DIAGNOSIS — R7303 Prediabetes: Secondary | ICD-10-CM | POA: Diagnosis not present

## 2023-04-13 DIAGNOSIS — I10 Essential (primary) hypertension: Secondary | ICD-10-CM | POA: Diagnosis not present

## 2023-04-13 DIAGNOSIS — E782 Mixed hyperlipidemia: Secondary | ICD-10-CM | POA: Diagnosis not present

## 2023-05-02 DIAGNOSIS — Z1389 Encounter for screening for other disorder: Secondary | ICD-10-CM | POA: Diagnosis not present

## 2023-05-02 DIAGNOSIS — G894 Chronic pain syndrome: Secondary | ICD-10-CM | POA: Diagnosis not present

## 2023-05-02 DIAGNOSIS — M479 Spondylosis, unspecified: Secondary | ICD-10-CM | POA: Diagnosis not present

## 2023-05-03 DIAGNOSIS — R7303 Prediabetes: Secondary | ICD-10-CM | POA: Diagnosis not present

## 2023-05-03 DIAGNOSIS — I10 Essential (primary) hypertension: Secondary | ICD-10-CM | POA: Diagnosis not present

## 2023-05-03 DIAGNOSIS — Z23 Encounter for immunization: Secondary | ICD-10-CM | POA: Diagnosis not present

## 2023-05-03 DIAGNOSIS — I129 Hypertensive chronic kidney disease with stage 1 through stage 4 chronic kidney disease, or unspecified chronic kidney disease: Secondary | ICD-10-CM | POA: Diagnosis not present

## 2023-05-03 DIAGNOSIS — N183 Chronic kidney disease, stage 3 unspecified: Secondary | ICD-10-CM | POA: Diagnosis not present

## 2023-05-09 DIAGNOSIS — J449 Chronic obstructive pulmonary disease, unspecified: Secondary | ICD-10-CM | POA: Diagnosis not present

## 2023-05-09 DIAGNOSIS — U071 COVID-19: Secondary | ICD-10-CM | POA: Diagnosis not present

## 2023-05-10 DIAGNOSIS — I129 Hypertensive chronic kidney disease with stage 1 through stage 4 chronic kidney disease, or unspecified chronic kidney disease: Secondary | ICD-10-CM | POA: Diagnosis not present

## 2023-05-10 DIAGNOSIS — Z23 Encounter for immunization: Secondary | ICD-10-CM | POA: Diagnosis not present

## 2023-05-23 DIAGNOSIS — J0100 Acute maxillary sinusitis, unspecified: Secondary | ICD-10-CM | POA: Diagnosis not present

## 2023-05-23 DIAGNOSIS — J209 Acute bronchitis, unspecified: Secondary | ICD-10-CM | POA: Diagnosis not present

## 2023-06-08 DIAGNOSIS — U071 COVID-19: Secondary | ICD-10-CM | POA: Diagnosis not present

## 2023-06-08 DIAGNOSIS — J449 Chronic obstructive pulmonary disease, unspecified: Secondary | ICD-10-CM | POA: Diagnosis not present

## 2023-06-10 DIAGNOSIS — I129 Hypertensive chronic kidney disease with stage 1 through stage 4 chronic kidney disease, or unspecified chronic kidney disease: Secondary | ICD-10-CM | POA: Diagnosis not present

## 2023-06-13 DIAGNOSIS — Z45010 Encounter for checking and testing of cardiac pacemaker pulse generator [battery]: Secondary | ICD-10-CM | POA: Diagnosis not present

## 2023-06-13 DIAGNOSIS — I442 Atrioventricular block, complete: Secondary | ICD-10-CM | POA: Diagnosis not present

## 2023-06-14 DIAGNOSIS — Z1389 Encounter for screening for other disorder: Secondary | ICD-10-CM | POA: Diagnosis not present

## 2023-06-14 DIAGNOSIS — M549 Dorsalgia, unspecified: Secondary | ICD-10-CM | POA: Diagnosis not present

## 2023-06-14 DIAGNOSIS — M479 Spondylosis, unspecified: Secondary | ICD-10-CM | POA: Diagnosis not present

## 2023-06-14 DIAGNOSIS — Z79899 Other long term (current) drug therapy: Secondary | ICD-10-CM | POA: Diagnosis not present

## 2023-06-14 DIAGNOSIS — G894 Chronic pain syndrome: Secondary | ICD-10-CM | POA: Diagnosis not present

## 2023-07-09 DIAGNOSIS — J449 Chronic obstructive pulmonary disease, unspecified: Secondary | ICD-10-CM | POA: Diagnosis not present

## 2023-07-09 DIAGNOSIS — U071 COVID-19: Secondary | ICD-10-CM | POA: Diagnosis not present

## 2023-07-10 DIAGNOSIS — I129 Hypertensive chronic kidney disease with stage 1 through stage 4 chronic kidney disease, or unspecified chronic kidney disease: Secondary | ICD-10-CM | POA: Diagnosis not present

## 2023-07-26 DIAGNOSIS — Z1389 Encounter for screening for other disorder: Secondary | ICD-10-CM | POA: Diagnosis not present

## 2023-07-26 DIAGNOSIS — Z79899 Other long term (current) drug therapy: Secondary | ICD-10-CM | POA: Diagnosis not present

## 2023-07-26 DIAGNOSIS — M549 Dorsalgia, unspecified: Secondary | ICD-10-CM | POA: Diagnosis not present

## 2023-07-26 DIAGNOSIS — M479 Spondylosis, unspecified: Secondary | ICD-10-CM | POA: Diagnosis not present

## 2023-07-26 DIAGNOSIS — G894 Chronic pain syndrome: Secondary | ICD-10-CM | POA: Diagnosis not present

## 2023-08-10 DIAGNOSIS — I129 Hypertensive chronic kidney disease with stage 1 through stage 4 chronic kidney disease, or unspecified chronic kidney disease: Secondary | ICD-10-CM | POA: Diagnosis not present

## 2023-09-01 DIAGNOSIS — Z1389 Encounter for screening for other disorder: Secondary | ICD-10-CM | POA: Diagnosis not present

## 2023-09-01 DIAGNOSIS — M549 Dorsalgia, unspecified: Secondary | ICD-10-CM | POA: Diagnosis not present

## 2023-09-01 DIAGNOSIS — Z79899 Other long term (current) drug therapy: Secondary | ICD-10-CM | POA: Diagnosis not present

## 2023-09-01 DIAGNOSIS — M479 Spondylosis, unspecified: Secondary | ICD-10-CM | POA: Diagnosis not present

## 2023-09-01 DIAGNOSIS — G894 Chronic pain syndrome: Secondary | ICD-10-CM | POA: Diagnosis not present

## 2023-09-10 DIAGNOSIS — I129 Hypertensive chronic kidney disease with stage 1 through stage 4 chronic kidney disease, or unspecified chronic kidney disease: Secondary | ICD-10-CM | POA: Diagnosis not present

## 2023-09-16 DIAGNOSIS — M75101 Unspecified rotator cuff tear or rupture of right shoulder, not specified as traumatic: Secondary | ICD-10-CM | POA: Diagnosis not present

## 2023-09-16 DIAGNOSIS — M12811 Other specific arthropathies, not elsewhere classified, right shoulder: Secondary | ICD-10-CM | POA: Diagnosis not present

## 2023-09-20 DIAGNOSIS — E782 Mixed hyperlipidemia: Secondary | ICD-10-CM | POA: Diagnosis not present

## 2023-09-20 DIAGNOSIS — R7303 Prediabetes: Secondary | ICD-10-CM | POA: Diagnosis not present

## 2023-09-27 DIAGNOSIS — I10 Essential (primary) hypertension: Secondary | ICD-10-CM | POA: Diagnosis not present

## 2023-09-27 DIAGNOSIS — E782 Mixed hyperlipidemia: Secondary | ICD-10-CM | POA: Diagnosis not present

## 2023-09-27 DIAGNOSIS — M810 Age-related osteoporosis without current pathological fracture: Secondary | ICD-10-CM | POA: Diagnosis not present

## 2023-09-27 DIAGNOSIS — R7303 Prediabetes: Secondary | ICD-10-CM | POA: Diagnosis not present

## 2023-09-27 DIAGNOSIS — J449 Chronic obstructive pulmonary disease, unspecified: Secondary | ICD-10-CM | POA: Diagnosis not present

## 2023-09-30 DIAGNOSIS — Z79899 Other long term (current) drug therapy: Secondary | ICD-10-CM | POA: Diagnosis not present

## 2023-09-30 DIAGNOSIS — M479 Spondylosis, unspecified: Secondary | ICD-10-CM | POA: Diagnosis not present

## 2023-09-30 DIAGNOSIS — Z1389 Encounter for screening for other disorder: Secondary | ICD-10-CM | POA: Diagnosis not present

## 2023-09-30 DIAGNOSIS — G894 Chronic pain syndrome: Secondary | ICD-10-CM | POA: Diagnosis not present

## 2023-09-30 DIAGNOSIS — M549 Dorsalgia, unspecified: Secondary | ICD-10-CM | POA: Diagnosis not present

## 2023-10-06 DIAGNOSIS — R531 Weakness: Secondary | ICD-10-CM | POA: Diagnosis not present

## 2023-10-06 DIAGNOSIS — H814 Vertigo of central origin: Secondary | ICD-10-CM | POA: Diagnosis not present

## 2023-10-08 DIAGNOSIS — I129 Hypertensive chronic kidney disease with stage 1 through stage 4 chronic kidney disease, or unspecified chronic kidney disease: Secondary | ICD-10-CM | POA: Diagnosis not present

## 2023-10-13 DIAGNOSIS — H814 Vertigo of central origin: Secondary | ICD-10-CM | POA: Diagnosis not present

## 2023-10-13 DIAGNOSIS — R531 Weakness: Secondary | ICD-10-CM | POA: Diagnosis not present

## 2023-10-17 DIAGNOSIS — H814 Vertigo of central origin: Secondary | ICD-10-CM | POA: Diagnosis not present

## 2023-10-17 DIAGNOSIS — R531 Weakness: Secondary | ICD-10-CM | POA: Diagnosis not present

## 2023-10-20 DIAGNOSIS — R531 Weakness: Secondary | ICD-10-CM | POA: Diagnosis not present

## 2023-10-20 DIAGNOSIS — H814 Vertigo of central origin: Secondary | ICD-10-CM | POA: Diagnosis not present

## 2023-10-25 DIAGNOSIS — H814 Vertigo of central origin: Secondary | ICD-10-CM | POA: Diagnosis not present

## 2023-10-25 DIAGNOSIS — R531 Weakness: Secondary | ICD-10-CM | POA: Diagnosis not present

## 2023-10-28 DIAGNOSIS — Z1389 Encounter for screening for other disorder: Secondary | ICD-10-CM | POA: Diagnosis not present

## 2023-10-28 DIAGNOSIS — M549 Dorsalgia, unspecified: Secondary | ICD-10-CM | POA: Diagnosis not present

## 2023-10-28 DIAGNOSIS — R531 Weakness: Secondary | ICD-10-CM | POA: Diagnosis not present

## 2023-10-28 DIAGNOSIS — H814 Vertigo of central origin: Secondary | ICD-10-CM | POA: Diagnosis not present

## 2023-10-28 DIAGNOSIS — M479 Spondylosis, unspecified: Secondary | ICD-10-CM | POA: Diagnosis not present

## 2023-11-01 DIAGNOSIS — H814 Vertigo of central origin: Secondary | ICD-10-CM | POA: Diagnosis not present

## 2023-11-01 DIAGNOSIS — R531 Weakness: Secondary | ICD-10-CM | POA: Diagnosis not present

## 2023-11-04 DIAGNOSIS — R531 Weakness: Secondary | ICD-10-CM | POA: Diagnosis not present

## 2023-11-04 DIAGNOSIS — H814 Vertigo of central origin: Secondary | ICD-10-CM | POA: Diagnosis not present

## 2023-11-08 DIAGNOSIS — M810 Age-related osteoporosis without current pathological fracture: Secondary | ICD-10-CM | POA: Diagnosis not present

## 2023-11-08 DIAGNOSIS — E782 Mixed hyperlipidemia: Secondary | ICD-10-CM | POA: Diagnosis not present

## 2023-11-08 DIAGNOSIS — R531 Weakness: Secondary | ICD-10-CM | POA: Diagnosis not present

## 2023-11-08 DIAGNOSIS — H814 Vertigo of central origin: Secondary | ICD-10-CM | POA: Diagnosis not present

## 2023-11-10 DIAGNOSIS — Z136 Encounter for screening for cardiovascular disorders: Secondary | ICD-10-CM | POA: Diagnosis not present

## 2023-11-10 DIAGNOSIS — R531 Weakness: Secondary | ICD-10-CM | POA: Diagnosis not present

## 2023-11-10 DIAGNOSIS — Z1389 Encounter for screening for other disorder: Secondary | ICD-10-CM | POA: Diagnosis not present

## 2023-11-10 DIAGNOSIS — Z139 Encounter for screening, unspecified: Secondary | ICD-10-CM | POA: Diagnosis not present

## 2023-11-10 DIAGNOSIS — H814 Vertigo of central origin: Secondary | ICD-10-CM | POA: Diagnosis not present

## 2023-11-10 DIAGNOSIS — Z0000 Encounter for general adult medical examination without abnormal findings: Secondary | ICD-10-CM | POA: Diagnosis not present

## 2023-11-15 DIAGNOSIS — H814 Vertigo of central origin: Secondary | ICD-10-CM | POA: Diagnosis not present

## 2023-11-15 DIAGNOSIS — R531 Weakness: Secondary | ICD-10-CM | POA: Diagnosis not present

## 2023-11-23 DIAGNOSIS — R531 Weakness: Secondary | ICD-10-CM | POA: Diagnosis not present

## 2023-11-23 DIAGNOSIS — H814 Vertigo of central origin: Secondary | ICD-10-CM | POA: Diagnosis not present

## 2023-11-25 DIAGNOSIS — Z1389 Encounter for screening for other disorder: Secondary | ICD-10-CM | POA: Diagnosis not present

## 2023-11-25 DIAGNOSIS — H814 Vertigo of central origin: Secondary | ICD-10-CM | POA: Diagnosis not present

## 2023-11-25 DIAGNOSIS — G894 Chronic pain syndrome: Secondary | ICD-10-CM | POA: Diagnosis not present

## 2023-11-25 DIAGNOSIS — M479 Spondylosis, unspecified: Secondary | ICD-10-CM | POA: Diagnosis not present

## 2023-11-25 DIAGNOSIS — M549 Dorsalgia, unspecified: Secondary | ICD-10-CM | POA: Diagnosis not present

## 2023-11-25 DIAGNOSIS — R531 Weakness: Secondary | ICD-10-CM | POA: Diagnosis not present

## 2023-11-30 DIAGNOSIS — H814 Vertigo of central origin: Secondary | ICD-10-CM | POA: Diagnosis not present

## 2023-11-30 DIAGNOSIS — R531 Weakness: Secondary | ICD-10-CM | POA: Diagnosis not present

## 2023-12-01 DIAGNOSIS — Z45010 Encounter for checking and testing of cardiac pacemaker pulse generator [battery]: Secondary | ICD-10-CM | POA: Diagnosis not present

## 2023-12-08 DIAGNOSIS — M810 Age-related osteoporosis without current pathological fracture: Secondary | ICD-10-CM | POA: Diagnosis not present

## 2023-12-08 DIAGNOSIS — E782 Mixed hyperlipidemia: Secondary | ICD-10-CM | POA: Diagnosis not present

## 2023-12-12 DIAGNOSIS — H814 Vertigo of central origin: Secondary | ICD-10-CM | POA: Diagnosis not present

## 2023-12-12 DIAGNOSIS — R531 Weakness: Secondary | ICD-10-CM | POA: Diagnosis not present

## 2023-12-14 DIAGNOSIS — R531 Weakness: Secondary | ICD-10-CM | POA: Diagnosis not present

## 2023-12-14 DIAGNOSIS — H814 Vertigo of central origin: Secondary | ICD-10-CM | POA: Diagnosis not present

## 2023-12-18 DIAGNOSIS — Z45010 Encounter for checking and testing of cardiac pacemaker pulse generator [battery]: Secondary | ICD-10-CM | POA: Diagnosis not present

## 2023-12-20 DIAGNOSIS — H814 Vertigo of central origin: Secondary | ICD-10-CM | POA: Diagnosis not present

## 2023-12-20 DIAGNOSIS — R531 Weakness: Secondary | ICD-10-CM | POA: Diagnosis not present

## 2023-12-22 DIAGNOSIS — R531 Weakness: Secondary | ICD-10-CM | POA: Diagnosis not present

## 2023-12-22 DIAGNOSIS — H814 Vertigo of central origin: Secondary | ICD-10-CM | POA: Diagnosis not present

## 2023-12-27 DIAGNOSIS — R531 Weakness: Secondary | ICD-10-CM | POA: Diagnosis not present

## 2023-12-27 DIAGNOSIS — H814 Vertigo of central origin: Secondary | ICD-10-CM | POA: Diagnosis not present

## 2024-01-08 DIAGNOSIS — M810 Age-related osteoporosis without current pathological fracture: Secondary | ICD-10-CM | POA: Diagnosis not present

## 2024-01-09 DIAGNOSIS — M25511 Pain in right shoulder: Secondary | ICD-10-CM | POA: Diagnosis not present

## 2024-01-09 DIAGNOSIS — G8929 Other chronic pain: Secondary | ICD-10-CM | POA: Diagnosis not present

## 2024-01-16 DIAGNOSIS — J449 Chronic obstructive pulmonary disease, unspecified: Secondary | ICD-10-CM | POA: Diagnosis not present

## 2024-01-30 DIAGNOSIS — S5012XA Contusion of left forearm, initial encounter: Secondary | ICD-10-CM | POA: Diagnosis not present

## 2024-01-30 DIAGNOSIS — I517 Cardiomegaly: Secondary | ICD-10-CM | POA: Diagnosis not present

## 2024-01-30 DIAGNOSIS — Z9181 History of falling: Secondary | ICD-10-CM | POA: Diagnosis not present

## 2024-01-30 DIAGNOSIS — M799 Soft tissue disorder, unspecified: Secondary | ICD-10-CM | POA: Diagnosis not present

## 2024-01-30 DIAGNOSIS — M25561 Pain in right knee: Secondary | ICD-10-CM | POA: Diagnosis not present

## 2024-01-30 DIAGNOSIS — M25562 Pain in left knee: Secondary | ICD-10-CM | POA: Diagnosis not present

## 2024-01-30 DIAGNOSIS — I491 Atrial premature depolarization: Secondary | ICD-10-CM | POA: Diagnosis not present

## 2024-01-30 DIAGNOSIS — S0011XA Contusion of right eyelid and periocular area, initial encounter: Secondary | ICD-10-CM | POA: Diagnosis not present

## 2024-01-30 DIAGNOSIS — S0031XA Abrasion of nose, initial encounter: Secondary | ICD-10-CM | POA: Diagnosis not present

## 2024-01-30 DIAGNOSIS — Z87891 Personal history of nicotine dependence: Secondary | ICD-10-CM | POA: Diagnosis not present

## 2024-01-30 DIAGNOSIS — R000 Tachycardia, unspecified: Secondary | ICD-10-CM | POA: Diagnosis not present

## 2024-01-30 DIAGNOSIS — M7989 Other specified soft tissue disorders: Secondary | ICD-10-CM | POA: Diagnosis not present

## 2024-01-30 DIAGNOSIS — S8011XA Contusion of right lower leg, initial encounter: Secondary | ICD-10-CM | POA: Diagnosis not present

## 2024-01-30 DIAGNOSIS — S0990XA Unspecified injury of head, initial encounter: Secondary | ICD-10-CM | POA: Diagnosis not present

## 2024-01-30 DIAGNOSIS — R0602 Shortness of breath: Secondary | ICD-10-CM | POA: Diagnosis not present

## 2024-01-30 DIAGNOSIS — R519 Headache, unspecified: Secondary | ICD-10-CM | POA: Diagnosis not present

## 2024-01-30 DIAGNOSIS — Z96652 Presence of left artificial knee joint: Secondary | ICD-10-CM | POA: Diagnosis not present

## 2024-01-30 DIAGNOSIS — S199XXA Unspecified injury of neck, initial encounter: Secondary | ICD-10-CM | POA: Diagnosis not present

## 2024-01-30 DIAGNOSIS — G9389 Other specified disorders of brain: Secondary | ICD-10-CM | POA: Diagnosis not present

## 2024-01-30 DIAGNOSIS — S8002XA Contusion of left knee, initial encounter: Secondary | ICD-10-CM | POA: Diagnosis not present

## 2024-01-30 DIAGNOSIS — Z950 Presence of cardiac pacemaker: Secondary | ICD-10-CM | POA: Diagnosis not present

## 2024-01-30 DIAGNOSIS — J90 Pleural effusion, not elsewhere classified: Secondary | ICD-10-CM | POA: Diagnosis not present

## 2024-01-30 DIAGNOSIS — R9082 White matter disease, unspecified: Secondary | ICD-10-CM | POA: Diagnosis not present

## 2024-01-30 DIAGNOSIS — I700 Atherosclerosis of aorta: Secondary | ICD-10-CM | POA: Diagnosis not present

## 2024-01-30 DIAGNOSIS — R079 Chest pain, unspecified: Secondary | ICD-10-CM | POA: Diagnosis not present

## 2024-01-30 DIAGNOSIS — S20211A Contusion of right front wall of thorax, initial encounter: Secondary | ICD-10-CM | POA: Diagnosis not present

## 2024-01-30 DIAGNOSIS — S8001XA Contusion of right knee, initial encounter: Secondary | ICD-10-CM | POA: Diagnosis not present

## 2024-01-30 DIAGNOSIS — K8020 Calculus of gallbladder without cholecystitis without obstruction: Secondary | ICD-10-CM | POA: Diagnosis not present

## 2024-01-30 DIAGNOSIS — S2001XA Contusion of right breast, initial encounter: Secondary | ICD-10-CM | POA: Diagnosis not present

## 2024-01-30 DIAGNOSIS — S5011XA Contusion of right forearm, initial encounter: Secondary | ICD-10-CM | POA: Diagnosis not present

## 2024-01-30 DIAGNOSIS — R9431 Abnormal electrocardiogram [ECG] [EKG]: Secondary | ICD-10-CM | POA: Diagnosis not present

## 2024-02-06 DIAGNOSIS — S20219A Contusion of unspecified front wall of thorax, initial encounter: Secondary | ICD-10-CM | POA: Diagnosis not present

## 2024-02-06 DIAGNOSIS — Z9181 History of falling: Secondary | ICD-10-CM | POA: Diagnosis not present

## 2024-02-06 DIAGNOSIS — S40022A Contusion of left upper arm, initial encounter: Secondary | ICD-10-CM | POA: Diagnosis not present

## 2024-02-06 DIAGNOSIS — S0083XA Contusion of other part of head, initial encounter: Secondary | ICD-10-CM | POA: Diagnosis not present

## 2024-02-06 DIAGNOSIS — S40021A Contusion of right upper arm, initial encounter: Secondary | ICD-10-CM | POA: Diagnosis not present

## 2024-02-07 DIAGNOSIS — Z79891 Long term (current) use of opiate analgesic: Secondary | ICD-10-CM | POA: Diagnosis not present

## 2024-02-07 DIAGNOSIS — M810 Age-related osteoporosis without current pathological fracture: Secondary | ICD-10-CM | POA: Diagnosis not present

## 2024-02-07 DIAGNOSIS — S22000A Wedge compression fracture of unspecified thoracic vertebra, initial encounter for closed fracture: Secondary | ICD-10-CM | POA: Diagnosis not present

## 2024-02-07 DIAGNOSIS — M479 Spondylosis, unspecified: Secondary | ICD-10-CM | POA: Diagnosis not present

## 2024-02-07 DIAGNOSIS — R531 Weakness: Secondary | ICD-10-CM | POA: Diagnosis not present

## 2024-02-09 DIAGNOSIS — R531 Weakness: Secondary | ICD-10-CM | POA: Diagnosis not present

## 2024-02-14 DIAGNOSIS — R531 Weakness: Secondary | ICD-10-CM | POA: Diagnosis not present

## 2024-02-16 DIAGNOSIS — R531 Weakness: Secondary | ICD-10-CM | POA: Diagnosis not present

## 2024-02-23 DIAGNOSIS — R531 Weakness: Secondary | ICD-10-CM | POA: Diagnosis not present

## 2024-02-27 DIAGNOSIS — R7303 Prediabetes: Secondary | ICD-10-CM | POA: Diagnosis not present

## 2024-02-27 DIAGNOSIS — E782 Mixed hyperlipidemia: Secondary | ICD-10-CM | POA: Diagnosis not present

## 2024-02-27 DIAGNOSIS — I10 Essential (primary) hypertension: Secondary | ICD-10-CM | POA: Diagnosis not present

## 2024-02-28 DIAGNOSIS — R531 Weakness: Secondary | ICD-10-CM | POA: Diagnosis not present

## 2024-03-01 DIAGNOSIS — R531 Weakness: Secondary | ICD-10-CM | POA: Diagnosis not present

## 2024-03-05 DIAGNOSIS — R7303 Prediabetes: Secondary | ICD-10-CM | POA: Diagnosis not present

## 2024-03-05 DIAGNOSIS — G252 Other specified forms of tremor: Secondary | ICD-10-CM | POA: Diagnosis not present

## 2024-03-05 DIAGNOSIS — R531 Weakness: Secondary | ICD-10-CM | POA: Diagnosis not present

## 2024-03-05 DIAGNOSIS — I10 Essential (primary) hypertension: Secondary | ICD-10-CM | POA: Diagnosis not present

## 2024-03-05 DIAGNOSIS — Z9181 History of falling: Secondary | ICD-10-CM | POA: Diagnosis not present

## 2024-03-05 DIAGNOSIS — E782 Mixed hyperlipidemia: Secondary | ICD-10-CM | POA: Diagnosis not present

## 2024-03-08 DIAGNOSIS — R531 Weakness: Secondary | ICD-10-CM | POA: Diagnosis not present

## 2024-03-09 DIAGNOSIS — M1611 Unilateral primary osteoarthritis, right hip: Secondary | ICD-10-CM | POA: Diagnosis not present

## 2024-03-09 DIAGNOSIS — M1711 Unilateral primary osteoarthritis, right knee: Secondary | ICD-10-CM | POA: Diagnosis not present

## 2024-03-09 DIAGNOSIS — S22000A Wedge compression fracture of unspecified thoracic vertebra, initial encounter for closed fracture: Secondary | ICD-10-CM | POA: Diagnosis not present

## 2024-03-09 DIAGNOSIS — M5459 Other low back pain: Secondary | ICD-10-CM | POA: Diagnosis not present

## 2024-03-09 DIAGNOSIS — M810 Age-related osteoporosis without current pathological fracture: Secondary | ICD-10-CM | POA: Diagnosis not present

## 2024-03-12 DIAGNOSIS — R531 Weakness: Secondary | ICD-10-CM | POA: Diagnosis not present

## 2024-03-15 DIAGNOSIS — R531 Weakness: Secondary | ICD-10-CM | POA: Diagnosis not present

## 2024-03-19 DIAGNOSIS — R531 Weakness: Secondary | ICD-10-CM | POA: Diagnosis not present

## 2024-03-23 DIAGNOSIS — R918 Other nonspecific abnormal finding of lung field: Secondary | ICD-10-CM | POA: Diagnosis not present

## 2024-03-23 DIAGNOSIS — R0602 Shortness of breath: Secondary | ICD-10-CM | POA: Diagnosis not present

## 2024-03-23 DIAGNOSIS — R062 Wheezing: Secondary | ICD-10-CM | POA: Diagnosis not present

## 2024-03-23 DIAGNOSIS — Z743 Need for continuous supervision: Secondary | ICD-10-CM | POA: Diagnosis not present

## 2024-03-23 DIAGNOSIS — S271XXA Traumatic hemothorax, initial encounter: Secondary | ICD-10-CM | POA: Diagnosis not present

## 2024-03-23 DIAGNOSIS — R0981 Nasal congestion: Secondary | ICD-10-CM | POA: Diagnosis not present

## 2024-03-23 DIAGNOSIS — R069 Unspecified abnormalities of breathing: Secondary | ICD-10-CM | POA: Diagnosis not present

## 2024-03-23 DIAGNOSIS — J449 Chronic obstructive pulmonary disease, unspecified: Secondary | ICD-10-CM | POA: Diagnosis not present

## 2024-03-23 DIAGNOSIS — J440 Chronic obstructive pulmonary disease with acute lower respiratory infection: Secondary | ICD-10-CM | POA: Diagnosis not present

## 2024-03-23 DIAGNOSIS — K219 Gastro-esophageal reflux disease without esophagitis: Secondary | ICD-10-CM | POA: Diagnosis not present

## 2024-03-23 DIAGNOSIS — I499 Cardiac arrhythmia, unspecified: Secondary | ICD-10-CM | POA: Diagnosis not present

## 2024-03-23 DIAGNOSIS — R051 Acute cough: Secondary | ICD-10-CM | POA: Diagnosis not present

## 2024-03-23 DIAGNOSIS — I10 Essential (primary) hypertension: Secondary | ICD-10-CM | POA: Diagnosis not present

## 2024-03-23 DIAGNOSIS — R0689 Other abnormalities of breathing: Secondary | ICD-10-CM | POA: Diagnosis not present

## 2024-03-23 DIAGNOSIS — J90 Pleural effusion, not elsewhere classified: Secondary | ICD-10-CM | POA: Diagnosis not present

## 2024-03-23 DIAGNOSIS — E785 Hyperlipidemia, unspecified: Secondary | ICD-10-CM | POA: Diagnosis not present

## 2024-03-23 DIAGNOSIS — J441 Chronic obstructive pulmonary disease with (acute) exacerbation: Secondary | ICD-10-CM | POA: Diagnosis not present

## 2024-03-23 DIAGNOSIS — Z87891 Personal history of nicotine dependence: Secondary | ICD-10-CM | POA: Diagnosis not present

## 2024-03-23 DIAGNOSIS — Z8673 Personal history of transient ischemic attack (TIA), and cerebral infarction without residual deficits: Secondary | ICD-10-CM | POA: Diagnosis not present

## 2024-03-23 DIAGNOSIS — Z950 Presence of cardiac pacemaker: Secondary | ICD-10-CM | POA: Diagnosis not present

## 2024-03-23 DIAGNOSIS — J9811 Atelectasis: Secondary | ICD-10-CM | POA: Diagnosis not present

## 2024-03-24 DIAGNOSIS — I10 Essential (primary) hypertension: Secondary | ICD-10-CM | POA: Diagnosis not present

## 2024-03-24 DIAGNOSIS — J90 Pleural effusion, not elsewhere classified: Secondary | ICD-10-CM | POA: Diagnosis not present

## 2024-03-24 DIAGNOSIS — R0600 Dyspnea, unspecified: Secondary | ICD-10-CM | POA: Diagnosis not present

## 2024-03-25 DIAGNOSIS — I10 Essential (primary) hypertension: Secondary | ICD-10-CM | POA: Diagnosis not present

## 2024-03-25 DIAGNOSIS — J90 Pleural effusion, not elsewhere classified: Secondary | ICD-10-CM | POA: Diagnosis not present

## 2024-03-26 DIAGNOSIS — J90 Pleural effusion, not elsewhere classified: Secondary | ICD-10-CM | POA: Diagnosis not present

## 2024-03-26 DIAGNOSIS — Z48813 Encounter for surgical aftercare following surgery on the respiratory system: Secondary | ICD-10-CM | POA: Diagnosis not present

## 2024-03-26 DIAGNOSIS — J9811 Atelectasis: Secondary | ICD-10-CM | POA: Diagnosis not present

## 2024-03-29 DIAGNOSIS — J9601 Acute respiratory failure with hypoxia: Secondary | ICD-10-CM | POA: Diagnosis not present

## 2024-03-29 DIAGNOSIS — S2249XA Multiple fractures of ribs, unspecified side, initial encounter for closed fracture: Secondary | ICD-10-CM | POA: Diagnosis not present

## 2024-03-29 DIAGNOSIS — Z789 Other specified health status: Secondary | ICD-10-CM | POA: Diagnosis not present

## 2024-03-29 DIAGNOSIS — J90 Pleural effusion, not elsewhere classified: Secondary | ICD-10-CM | POA: Diagnosis not present

## 2024-04-06 DIAGNOSIS — I493 Ventricular premature depolarization: Secondary | ICD-10-CM | POA: Diagnosis not present

## 2024-04-06 DIAGNOSIS — Z950 Presence of cardiac pacemaker: Secondary | ICD-10-CM | POA: Diagnosis not present

## 2024-04-09 DIAGNOSIS — E782 Mixed hyperlipidemia: Secondary | ICD-10-CM | POA: Diagnosis not present

## 2024-04-09 DIAGNOSIS — M810 Age-related osteoporosis without current pathological fracture: Secondary | ICD-10-CM | POA: Diagnosis not present

## 2024-04-30 ENCOUNTER — Other Ambulatory Visit (HOSPITAL_BASED_OUTPATIENT_CLINIC_OR_DEPARTMENT_OTHER): Payer: Self-pay | Admitting: Family Medicine

## 2024-04-30 ENCOUNTER — Ambulatory Visit (HOSPITAL_BASED_OUTPATIENT_CLINIC_OR_DEPARTMENT_OTHER)
Admission: RE | Admit: 2024-04-30 | Discharge: 2024-04-30 | Disposition: A | Discharge status: Home / Self Care | Source: Ambulatory Visit | Attending: Family Medicine | Admitting: Radiology

## 2024-04-30 DIAGNOSIS — J90 Pleural effusion, not elsewhere classified: Secondary | ICD-10-CM

## 2024-04-30 DIAGNOSIS — J9811 Atelectasis: Secondary | ICD-10-CM | POA: Diagnosis not present

## 2024-04-30 DIAGNOSIS — Z48813 Encounter for surgical aftercare following surgery on the respiratory system: Secondary | ICD-10-CM | POA: Diagnosis not present

## 2024-04-30 DIAGNOSIS — I771 Stricture of artery: Secondary | ICD-10-CM | POA: Diagnosis not present

## 2024-05-03 DIAGNOSIS — M810 Age-related osteoporosis without current pathological fracture: Secondary | ICD-10-CM | POA: Diagnosis not present

## 2024-05-03 DIAGNOSIS — Z23 Encounter for immunization: Secondary | ICD-10-CM | POA: Diagnosis not present

## 2024-05-03 DIAGNOSIS — J90 Pleural effusion, not elsewhere classified: Secondary | ICD-10-CM | POA: Diagnosis not present

## 2024-05-09 DIAGNOSIS — E782 Mixed hyperlipidemia: Secondary | ICD-10-CM | POA: Diagnosis not present

## 2024-05-09 DIAGNOSIS — M810 Age-related osteoporosis without current pathological fracture: Secondary | ICD-10-CM | POA: Diagnosis not present

## 2024-05-29 DIAGNOSIS — Z950 Presence of cardiac pacemaker: Secondary | ICD-10-CM | POA: Diagnosis not present

## 2024-05-29 DIAGNOSIS — I4891 Unspecified atrial fibrillation: Secondary | ICD-10-CM | POA: Diagnosis not present

## 2024-05-29 DIAGNOSIS — R002 Palpitations: Secondary | ICD-10-CM | POA: Diagnosis not present
